# Patient Record
Sex: Female | Born: 1955 | Race: Black or African American | Hispanic: No | Smoking: Never smoker
Health system: Southern US, Community
[De-identification: ages and names within clinical notes are randomized; demographics above are authoritative.]

## PROBLEM LIST (undated history)

## (undated) DIAGNOSIS — M1990 Unspecified osteoarthritis, unspecified site: Secondary | ICD-10-CM

## (undated) DIAGNOSIS — E785 Hyperlipidemia, unspecified: Secondary | ICD-10-CM

## (undated) DIAGNOSIS — F1010 Alcohol abuse, uncomplicated: Secondary | ICD-10-CM

## (undated) DIAGNOSIS — I5042 Chronic combined systolic (congestive) and diastolic (congestive) heart failure: Secondary | ICD-10-CM

## (undated) DIAGNOSIS — G4733 Obstructive sleep apnea (adult) (pediatric): Secondary | ICD-10-CM

## (undated) DIAGNOSIS — I428 Other cardiomyopathies: Secondary | ICD-10-CM

## (undated) DIAGNOSIS — I10 Essential (primary) hypertension: Secondary | ICD-10-CM

## (undated) HISTORY — DX: Unspecified osteoarthritis, unspecified site: M19.90

## (undated) HISTORY — PX: KNEE SURGERY: SHX244

## (undated) HISTORY — DX: Morbid (severe) obesity due to excess calories: E66.01

## (undated) HISTORY — PX: ABDOMINAL HYSTERECTOMY: SHX81

## (undated) HISTORY — PX: OTHER SURGICAL HISTORY: SHX169

## (undated) HISTORY — DX: Essential (primary) hypertension: I10

## (undated) HISTORY — DX: Hyperlipidemia, unspecified: E78.5

## (undated) HISTORY — PX: BREAST SURGERY: SHX581

---

## 2004-07-02 ENCOUNTER — Emergency Department (HOSPITAL_COMMUNITY): Admission: EM | Admit: 2004-07-02 | Discharge: 2004-07-02 | Payer: Self-pay | Admitting: *Deleted

## 2004-07-15 ENCOUNTER — Emergency Department (HOSPITAL_COMMUNITY): Admission: EM | Admit: 2004-07-15 | Discharge: 2004-07-15 | Payer: Self-pay | Admitting: Emergency Medicine

## 2004-11-13 ENCOUNTER — Encounter: Admission: RE | Admit: 2004-11-13 | Discharge: 2004-11-13 | Payer: Self-pay | Admitting: Internal Medicine

## 2007-04-05 ENCOUNTER — Ambulatory Visit: Payer: Self-pay | Admitting: Family Medicine

## 2007-04-06 ENCOUNTER — Ambulatory Visit: Payer: Self-pay | Admitting: *Deleted

## 2007-04-11 ENCOUNTER — Ambulatory Visit: Payer: Self-pay | Admitting: Family Medicine

## 2007-04-13 ENCOUNTER — Ambulatory Visit (HOSPITAL_COMMUNITY): Admission: RE | Admit: 2007-04-13 | Discharge: 2007-04-13 | Payer: Self-pay | Admitting: Family Medicine

## 2007-04-25 ENCOUNTER — Ambulatory Visit: Payer: Self-pay | Admitting: Family Medicine

## 2007-07-22 ENCOUNTER — Ambulatory Visit: Payer: Self-pay | Admitting: Family Medicine

## 2007-07-22 ENCOUNTER — Encounter (INDEPENDENT_AMBULATORY_CARE_PROVIDER_SITE_OTHER): Payer: Self-pay | Admitting: Family Medicine

## 2007-08-22 ENCOUNTER — Ambulatory Visit: Payer: Self-pay | Admitting: Family Medicine

## 2007-08-22 LAB — CONVERTED CEMR LAB
AST: 11 units/L (ref 0–37)
BUN: 14 mg/dL (ref 6–23)
CO2: 24 meq/L (ref 19–32)
Calcium: 9 mg/dL (ref 8.4–10.5)
Chloride: 104 meq/L (ref 96–112)
Creatinine, Ser: 0.82 mg/dL (ref 0.40–1.20)
Eosinophils Absolute: 0.1 10*3/uL — ABNORMAL LOW (ref 0.2–0.7)
Eosinophils Relative: 1 % (ref 0–5)
HCT: 43.7 % (ref 36.0–46.0)
Hemoglobin: 14.1 g/dL (ref 12.0–15.0)
LDL Cholesterol: 113 mg/dL — ABNORMAL HIGH (ref 0–99)
Lymphocytes Relative: 21 % (ref 12–46)
Lymphs Abs: 2.1 10*3/uL (ref 0.7–4.0)
MCHC: 32.3 g/dL (ref 30.0–36.0)
MCV: 88.5 fL (ref 78.0–100.0)
Potassium: 3.9 meq/L (ref 3.5–5.3)
RBC: 4.94 M/uL (ref 3.87–5.11)
Sodium: 140 meq/L (ref 135–145)

## 2007-09-26 ENCOUNTER — Ambulatory Visit: Payer: Self-pay | Admitting: Family Medicine

## 2007-10-11 ENCOUNTER — Emergency Department (HOSPITAL_COMMUNITY): Admission: EM | Admit: 2007-10-11 | Discharge: 2007-10-11 | Payer: Self-pay | Admitting: Emergency Medicine

## 2007-12-01 ENCOUNTER — Emergency Department (HOSPITAL_COMMUNITY): Admission: EM | Admit: 2007-12-01 | Discharge: 2007-12-01 | Payer: Self-pay | Admitting: Emergency Medicine

## 2007-12-20 ENCOUNTER — Ambulatory Visit: Payer: Self-pay | Admitting: Family Medicine

## 2008-01-24 ENCOUNTER — Ambulatory Visit: Payer: Self-pay | Admitting: Family Medicine

## 2008-01-24 ENCOUNTER — Encounter (INDEPENDENT_AMBULATORY_CARE_PROVIDER_SITE_OTHER): Payer: Self-pay | Admitting: Family Medicine

## 2008-01-25 ENCOUNTER — Encounter (INDEPENDENT_AMBULATORY_CARE_PROVIDER_SITE_OTHER): Payer: Self-pay | Admitting: Family Medicine

## 2008-01-25 LAB — CONVERTED CEMR LAB
Chlamydia, DNA Probe: NEGATIVE
GC Probe Amp, Genital: NEGATIVE

## 2008-06-21 ENCOUNTER — Ambulatory Visit: Payer: Self-pay | Admitting: Family Medicine

## 2008-06-22 ENCOUNTER — Encounter (INDEPENDENT_AMBULATORY_CARE_PROVIDER_SITE_OTHER): Payer: Self-pay | Admitting: Family Medicine

## 2008-06-22 LAB — CONVERTED CEMR LAB
AST: 15 units/L (ref 0–37)
Alkaline Phosphatase: 74 units/L (ref 39–117)
BUN: 13 mg/dL (ref 6–23)
Calcium: 9.3 mg/dL (ref 8.4–10.5)
Chloride: 104 meq/L (ref 96–112)
Glucose, Bld: 89 mg/dL (ref 70–99)
Potassium: 4.3 meq/L (ref 3.5–5.3)

## 2008-06-27 ENCOUNTER — Ambulatory Visit (HOSPITAL_COMMUNITY): Admission: RE | Admit: 2008-06-27 | Discharge: 2008-06-27 | Payer: Self-pay | Admitting: Family Medicine

## 2008-07-23 ENCOUNTER — Emergency Department (HOSPITAL_COMMUNITY): Admission: EM | Admit: 2008-07-23 | Discharge: 2008-07-23 | Payer: Self-pay | Admitting: Emergency Medicine

## 2008-07-27 ENCOUNTER — Ambulatory Visit (HOSPITAL_COMMUNITY): Admission: RE | Admit: 2008-07-27 | Discharge: 2008-07-27 | Payer: Self-pay | Admitting: Family Medicine

## 2008-08-20 ENCOUNTER — Emergency Department (HOSPITAL_COMMUNITY): Admission: EM | Admit: 2008-08-20 | Discharge: 2008-08-20 | Payer: Self-pay | Admitting: Family Medicine

## 2008-08-22 ENCOUNTER — Emergency Department (HOSPITAL_COMMUNITY): Admission: EM | Admit: 2008-08-22 | Discharge: 2008-08-22 | Payer: Self-pay | Admitting: Emergency Medicine

## 2008-12-18 ENCOUNTER — Emergency Department (HOSPITAL_COMMUNITY): Admission: EM | Admit: 2008-12-18 | Discharge: 2008-12-18 | Payer: Self-pay | Admitting: Emergency Medicine

## 2008-12-25 ENCOUNTER — Ambulatory Visit: Payer: Self-pay | Admitting: Family Medicine

## 2008-12-26 ENCOUNTER — Ambulatory Visit (HOSPITAL_COMMUNITY): Admission: RE | Admit: 2008-12-26 | Discharge: 2008-12-26 | Payer: Self-pay | Admitting: Family Medicine

## 2009-01-03 ENCOUNTER — Ambulatory Visit (HOSPITAL_COMMUNITY): Admission: RE | Admit: 2009-01-03 | Discharge: 2009-01-03 | Payer: Self-pay | Admitting: Family Medicine

## 2009-01-09 ENCOUNTER — Ambulatory Visit (HOSPITAL_COMMUNITY): Admission: RE | Admit: 2009-01-09 | Discharge: 2009-01-09 | Payer: Self-pay | Admitting: Family Medicine

## 2009-01-30 ENCOUNTER — Emergency Department (HOSPITAL_COMMUNITY): Admission: EM | Admit: 2009-01-30 | Discharge: 2009-01-30 | Payer: Self-pay | Admitting: Emergency Medicine

## 2009-02-14 ENCOUNTER — Ambulatory Visit: Payer: Self-pay | Admitting: Family Medicine

## 2009-12-25 ENCOUNTER — Emergency Department (HOSPITAL_COMMUNITY): Admission: EM | Admit: 2009-12-25 | Discharge: 2009-12-25 | Payer: Self-pay | Admitting: Emergency Medicine

## 2009-12-28 ENCOUNTER — Emergency Department (HOSPITAL_COMMUNITY): Admission: EM | Admit: 2009-12-28 | Discharge: 2009-12-28 | Payer: Self-pay | Admitting: Family Medicine

## 2010-04-22 ENCOUNTER — Emergency Department (HOSPITAL_COMMUNITY): Admission: EM | Admit: 2010-04-22 | Discharge: 2010-04-22 | Payer: Self-pay | Admitting: Emergency Medicine

## 2010-05-16 ENCOUNTER — Emergency Department (HOSPITAL_COMMUNITY): Admission: EM | Admit: 2010-05-16 | Discharge: 2010-05-16 | Payer: Self-pay | Admitting: Emergency Medicine

## 2010-12-28 LAB — URINALYSIS, ROUTINE W REFLEX MICROSCOPIC
Bilirubin Urine: NEGATIVE
Glucose, UA: NEGATIVE mg/dL
Hgb urine dipstick: NEGATIVE
Nitrite: NEGATIVE
Protein, ur: NEGATIVE mg/dL
pH: 6 (ref 5.0–8.0)

## 2010-12-28 LAB — POCT I-STAT, CHEM 8
Calcium, Ion: 1.03 mmol/L — ABNORMAL LOW (ref 1.12–1.32)
Glucose, Bld: 101 mg/dL — ABNORMAL HIGH (ref 70–99)
Hemoglobin: 15.6 g/dL — ABNORMAL HIGH (ref 12.0–15.0)
Potassium: 3.9 mEq/L (ref 3.5–5.1)
Sodium: 140 mEq/L (ref 135–145)
TCO2: 25 mmol/L (ref 0–100)

## 2010-12-28 LAB — RPR: RPR Ser Ql: NONREACTIVE

## 2010-12-28 LAB — GC/CHLAMYDIA PROBE AMP, GENITAL: GC Probe Amp, Genital: NEGATIVE

## 2010-12-28 LAB — WET PREP, GENITAL: WBC, Wet Prep HPF POC: NONE SEEN

## 2010-12-28 LAB — PREGNANCY, URINE: Preg Test, Ur: NEGATIVE

## 2011-01-04 LAB — CULTURE, ROUTINE-ABSCESS

## 2011-05-01 ENCOUNTER — Other Ambulatory Visit: Payer: Self-pay | Admitting: Internal Medicine

## 2011-05-01 DIAGNOSIS — R1011 Right upper quadrant pain: Secondary | ICD-10-CM

## 2011-05-04 ENCOUNTER — Ambulatory Visit (HOSPITAL_COMMUNITY)
Admission: RE | Admit: 2011-05-04 | Discharge: 2011-05-04 | Disposition: A | Discharge status: Home / Self Care | Payer: Self-pay | Source: Ambulatory Visit | Attending: Internal Medicine | Admitting: Internal Medicine

## 2011-05-04 DIAGNOSIS — R7989 Other specified abnormal findings of blood chemistry: Secondary | ICD-10-CM | POA: Insufficient documentation

## 2011-05-04 DIAGNOSIS — R1011 Right upper quadrant pain: Secondary | ICD-10-CM

## 2011-06-02 ENCOUNTER — Inpatient Hospital Stay (INDEPENDENT_AMBULATORY_CARE_PROVIDER_SITE_OTHER)
Admission: RE | Admit: 2011-06-02 | Discharge: 2011-06-02 | Disposition: A | Discharge status: Home / Self Care | Payer: Self-pay | Source: Ambulatory Visit | Attending: Family Medicine | Admitting: Family Medicine

## 2011-06-02 DIAGNOSIS — T7840XA Allergy, unspecified, initial encounter: Secondary | ICD-10-CM

## 2011-06-02 DIAGNOSIS — L509 Urticaria, unspecified: Secondary | ICD-10-CM

## 2011-06-06 ENCOUNTER — Emergency Department (HOSPITAL_COMMUNITY)
Admission: EM | Admit: 2011-06-06 | Discharge: 2011-06-06 | Disposition: A | Discharge status: Home / Self Care | Payer: Self-pay | Attending: Emergency Medicine | Admitting: Emergency Medicine

## 2011-06-06 ENCOUNTER — Emergency Department (HOSPITAL_COMMUNITY): Payer: Self-pay

## 2011-06-06 DIAGNOSIS — R109 Unspecified abdominal pain: Secondary | ICD-10-CM | POA: Insufficient documentation

## 2011-06-06 DIAGNOSIS — I10 Essential (primary) hypertension: Secondary | ICD-10-CM | POA: Insufficient documentation

## 2011-06-06 DIAGNOSIS — R110 Nausea: Secondary | ICD-10-CM | POA: Insufficient documentation

## 2011-06-06 DIAGNOSIS — R748 Abnormal levels of other serum enzymes: Secondary | ICD-10-CM | POA: Insufficient documentation

## 2011-06-06 LAB — DIFFERENTIAL
Basophils Relative: 0 % (ref 0–1)
Eosinophils Absolute: 0.1 10*3/uL (ref 0.0–0.7)
Smear Review: ADEQUATE

## 2011-06-06 LAB — HEPATITIS PANEL, ACUTE
HCV Ab: NEGATIVE
Hep A IgM: NEGATIVE
Hepatitis B Surface Ag: NEGATIVE

## 2011-06-06 LAB — CBC
Hemoglobin: 15.3 g/dL — ABNORMAL HIGH (ref 12.0–15.0)
MCH: 29.7 pg (ref 26.0–34.0)
MCHC: 35.4 g/dL (ref 30.0–36.0)
Platelets: ADEQUATE 10*3/uL (ref 150–400)
RDW: 12.9 % (ref 11.5–15.5)

## 2011-06-06 LAB — URINALYSIS, ROUTINE W REFLEX MICROSCOPIC
Glucose, UA: NEGATIVE mg/dL
Ketones, ur: NEGATIVE mg/dL
Leukocytes, UA: NEGATIVE
Nitrite: NEGATIVE
Protein, ur: NEGATIVE mg/dL
Urobilinogen, UA: 1 mg/dL (ref 0.0–1.0)

## 2011-06-06 LAB — COMPREHENSIVE METABOLIC PANEL
ALT: 122 U/L — ABNORMAL HIGH (ref 0–35)
AST: 149 U/L — ABNORMAL HIGH (ref 0–37)
CO2: 26 mEq/L (ref 19–32)
Calcium: 9.7 mg/dL (ref 8.4–10.5)
Creatinine, Ser: 0.78 mg/dL (ref 0.50–1.10)
GFR calc non Af Amer: >60 mL/min (ref >60)
Sodium: 139 mEq/L (ref 135–145)
Total Protein: 7.8 g/dL (ref 6.0–8.3)

## 2011-06-06 MED ORDER — IOHEXOL 300 MG/ML  SOLN
125.0000 mL | Freq: Once | INTRAMUSCULAR | Status: AC | PRN
  Administered 2011-06-06: 125 mL via INTRAVENOUS

## 2011-06-08 ENCOUNTER — Encounter: Payer: Self-pay | Admitting: Internal Medicine

## 2011-06-08 ENCOUNTER — Emergency Department (HOSPITAL_COMMUNITY): Payer: Self-pay

## 2011-06-08 ENCOUNTER — Emergency Department (HOSPITAL_COMMUNITY)
Admission: EM | Admit: 2011-06-08 | Discharge: 2011-06-08 | Disposition: A | Discharge status: Home / Self Care | Payer: Self-pay | Attending: Emergency Medicine | Admitting: Emergency Medicine

## 2011-06-08 DIAGNOSIS — K7689 Other specified diseases of liver: Secondary | ICD-10-CM | POA: Insufficient documentation

## 2011-06-08 DIAGNOSIS — R109 Unspecified abdominal pain: Secondary | ICD-10-CM | POA: Insufficient documentation

## 2011-06-08 DIAGNOSIS — K219 Gastro-esophageal reflux disease without esophagitis: Secondary | ICD-10-CM | POA: Insufficient documentation

## 2011-07-07 ENCOUNTER — Ambulatory Visit: Payer: Self-pay | Admitting: Internal Medicine

## 2011-07-14 LAB — CULTURE, ROUTINE-ABSCESS

## 2011-07-31 ENCOUNTER — Telehealth: Payer: Self-pay | Admitting: Internal Medicine

## 2011-07-31 NOTE — Telephone Encounter (Signed)
Message copied by Arna Snipe on Fri Jul 31, 2011  7:57 AM ------      Message from: Francia Greaves      Created: Tue Jul 07, 2011  2:53 PM       Charge patient no show fee for 07/07/11

## 2011-11-17 IMAGING — CT CT ABD-PELV W/ CM
2 of 5 series · 17 of 46 positions shown, 19 images · IV contrast (APPLIED)
Comparison: [DATE].

CLINICAL DATA: Abdominal pain.  Nausea.

CT ABDOMEN AND PELVIS WITH CONTRAST
TECHNIQUE: Multidetector CT imaging of the abdomen and pelvis was
performed following the standard protocol during bolus
administration of intravenous contrast.
Contrast: 125 ml Omnipaque-KOO

[Series 2: abd_pel 5.0 b40f st · axial · 0.75mm/px · z∈[-516,-112]mm · 14 of 91 slices shown, 16 images]
[im 5/91  soft-tissue]
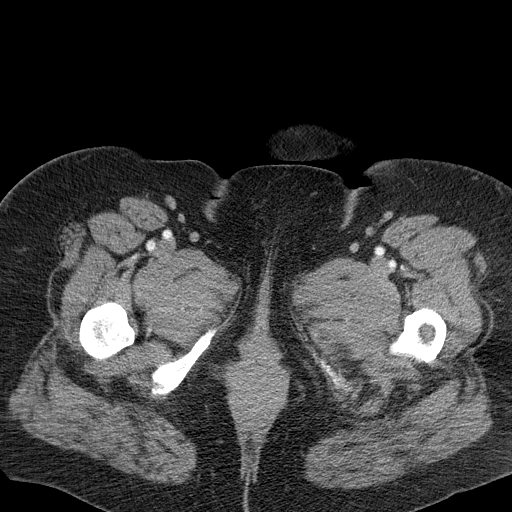
[im 5/91  bone]
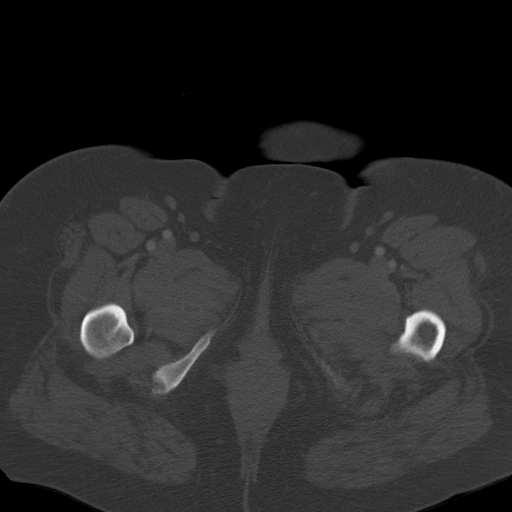
[im 10/91  soft-tissue]
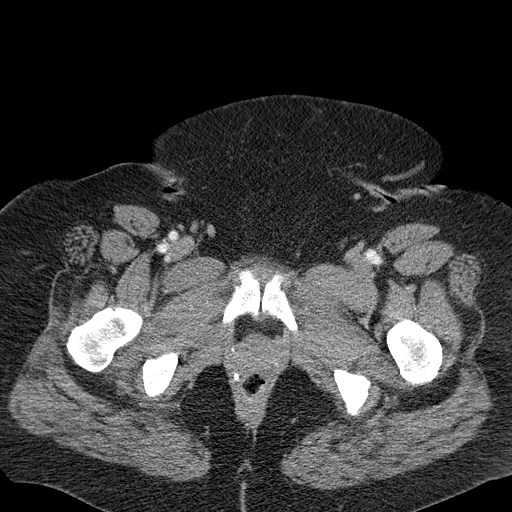
[im 19/91  soft-tissue]
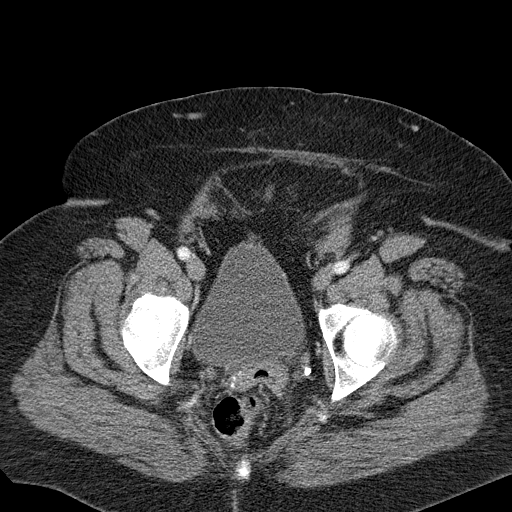
[im 24/91  soft-tissue]
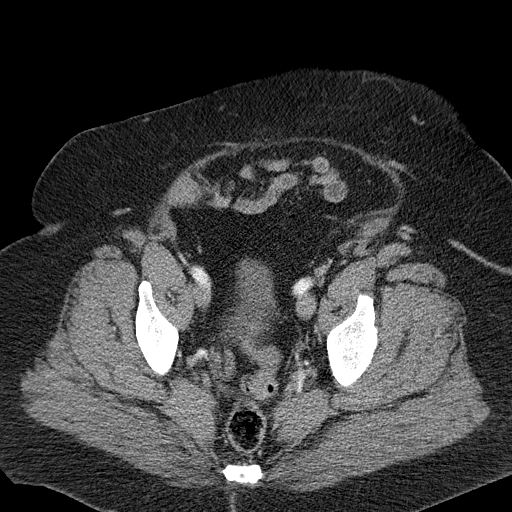
[im 29/91  soft-tissue]
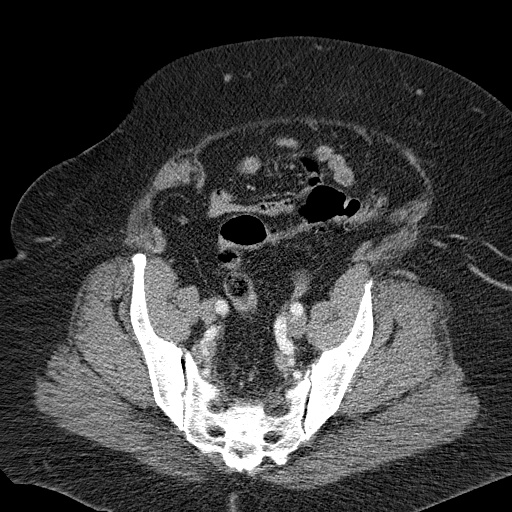
[im 38/91  soft-tissue]
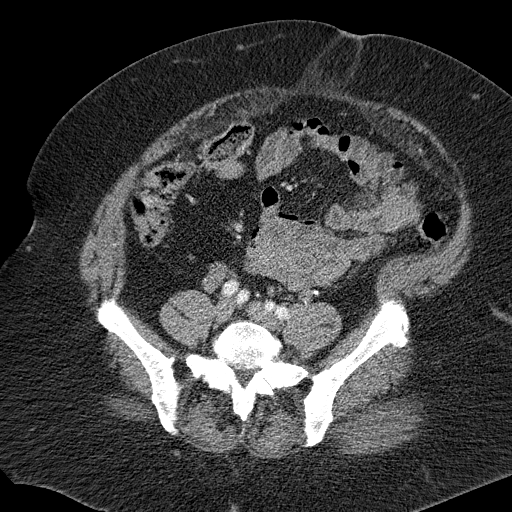
[im 43/91  soft-tissue]
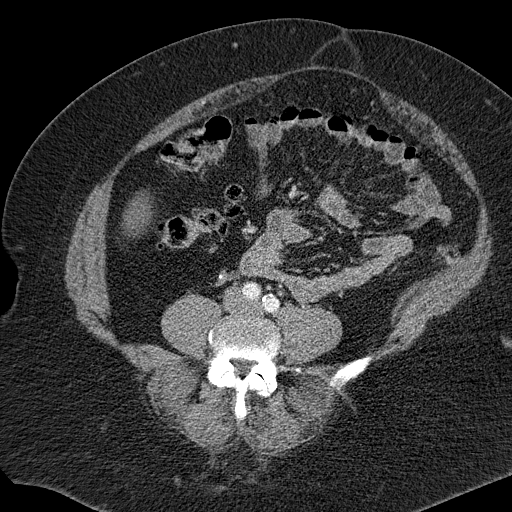
[im 48/91  soft-tissue]
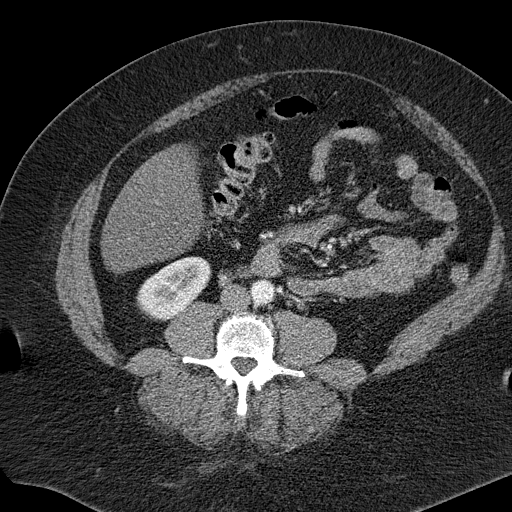
[im 53/91  soft-tissue]
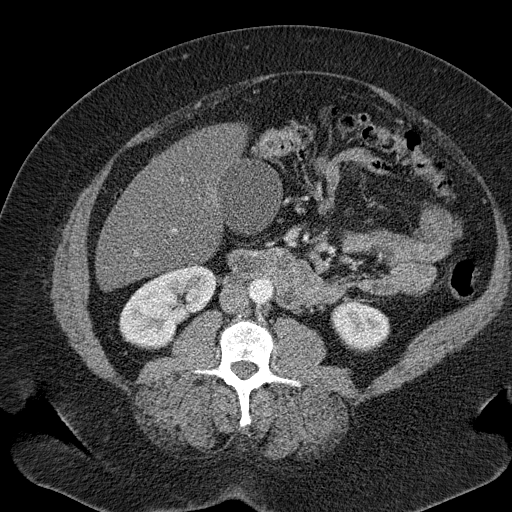
[im 53/91  bone]
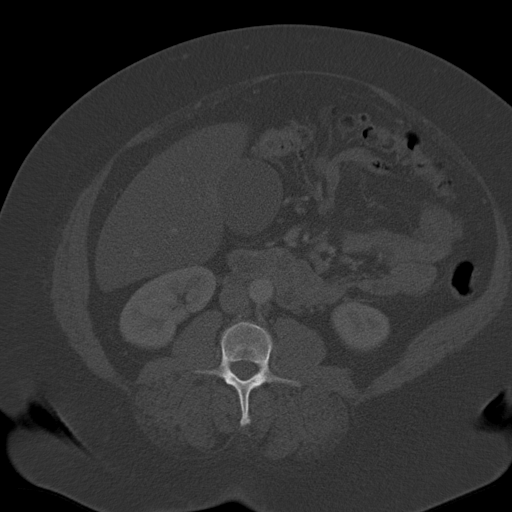
[im 62/91  soft-tissue]
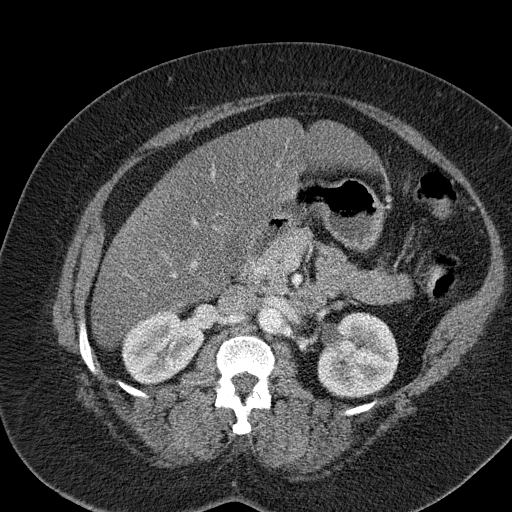
[im 67/91  soft-tissue]
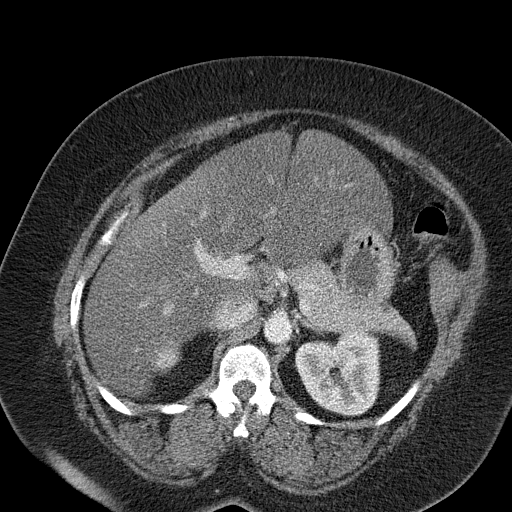
[im 72/91  soft-tissue]
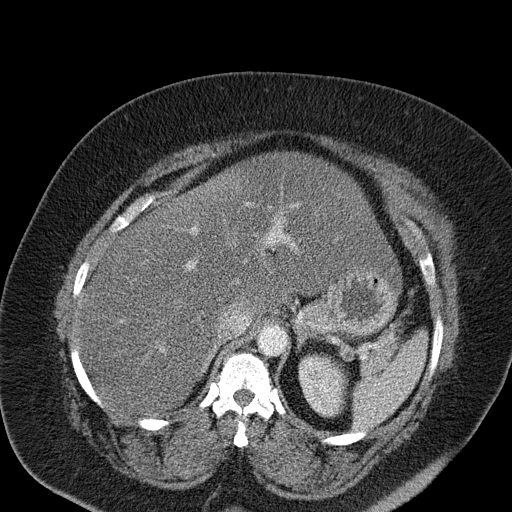
[im 81/91  soft-tissue]
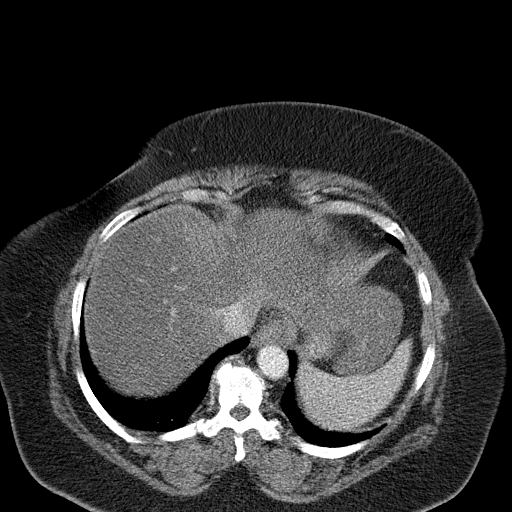
[im 86/91  soft-tissue]
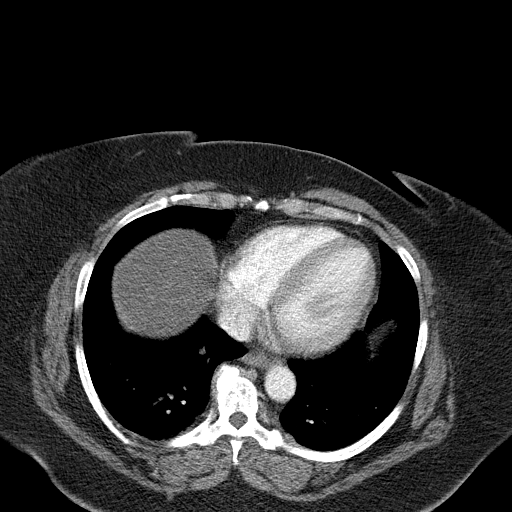

[Series 602: coronal · coronal · 0.92mm/px · 3 of 114 slices shown]
[im 38/114  soft-tissue]
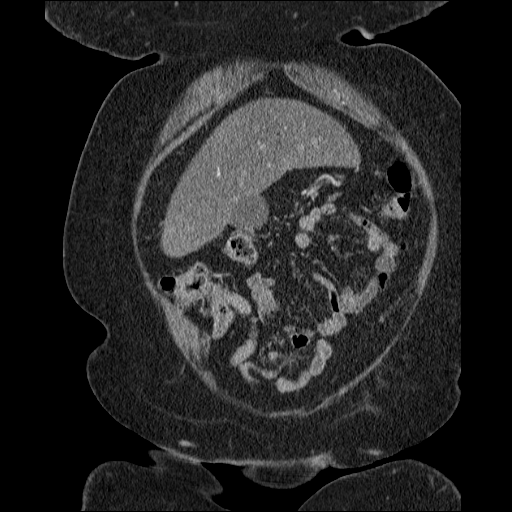
[im 51/114  soft-tissue]
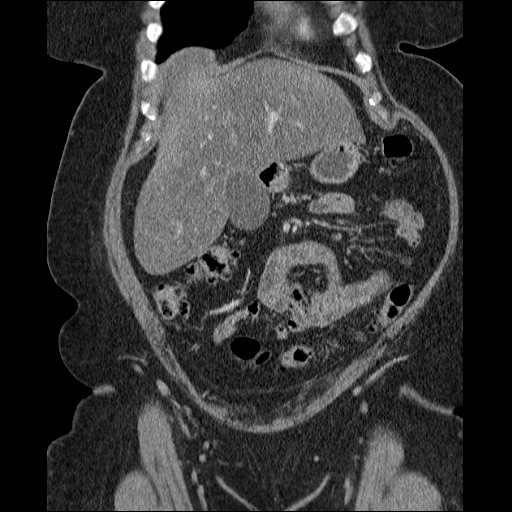
[im 63/114  soft-tissue]
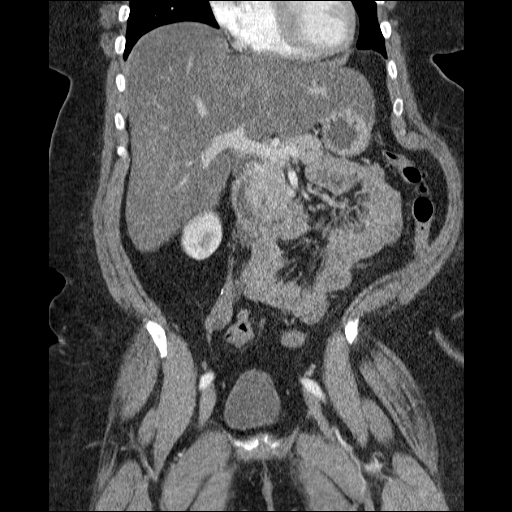

[17 of 46 positions shown; findings below may reference images not displayed]

FINDINGS: The liver is diffusely fatty measures 22.4 cm in cranial
caudal length, enlarged.  No focal abnormalities seen within the
hepatic parenchyma.  Spleen is unremarkable.  The stomach,
duodenum, pancreas, gallbladder, adrenal glands, and kidneys have
normal imaging features.

No abdominal aortic aneurysm.  No free fluid or lymphadenopathy in
the abdomen.

Imaging through the pelvis shows no intraperitoneal free fluid.  No
pelvic sidewall lymphadenopathy.  Bladder is normal.  Uterus is
surgically absent.  No adnexal mass.

Diverticular changes are seen in the sigmoid colon without
diverticulitis.  Terminal ileum is normal.  The appendix is normal.

Bone windows reveal no worrisome lytic or sclerotic osseous
lesions.  Umbilical hernia contains only fat
IMPRESSION: Fatty enlarged liver.

No acute findings in the abdomen or pelvis.

## 2011-11-19 IMAGING — US US ABDOMEN COMPLETE
1 series · 14 of 25 positions shown · non-contrast
Comparison: 06/06/2011 CT

CLINICAL DATA: Abdominal pain.

COMPLETE ABDOMINAL ULTRASOUND

[Series 1: us abdomen complete · 0.33mm/px · 14 of 46 slices shown]
[im 1/46]
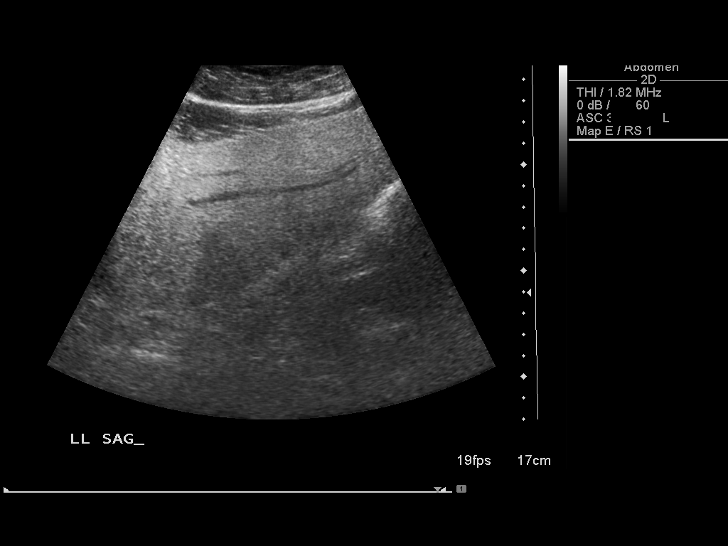
[im 4/46]
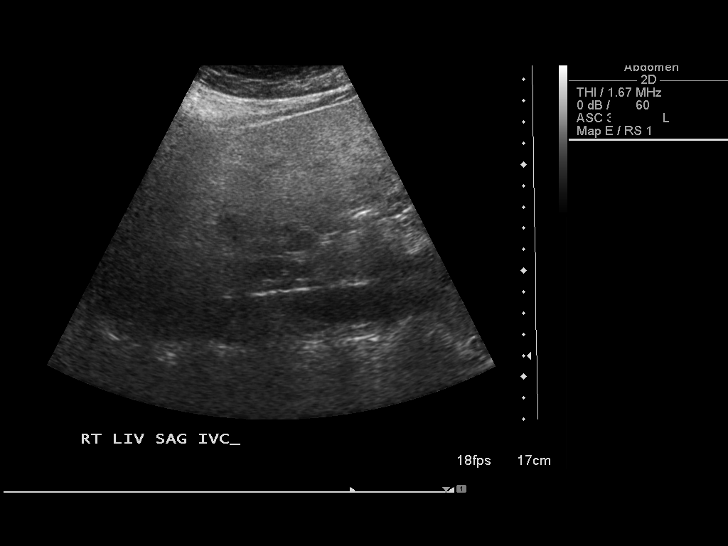
[im 8/46]
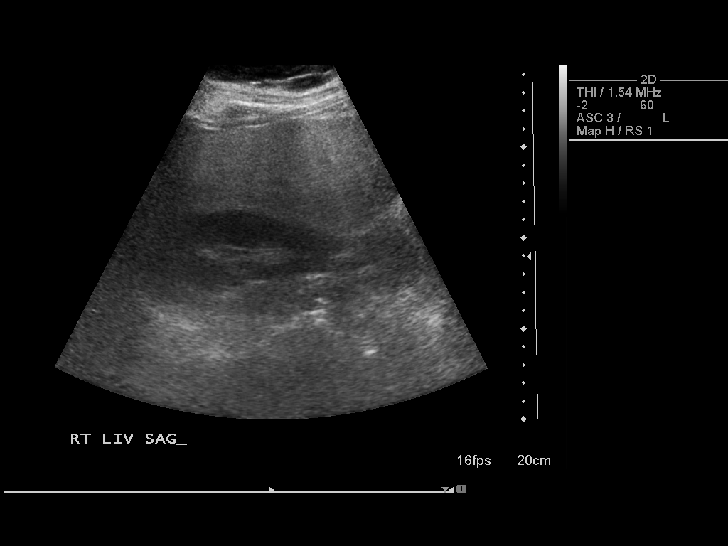
[im 12/46]
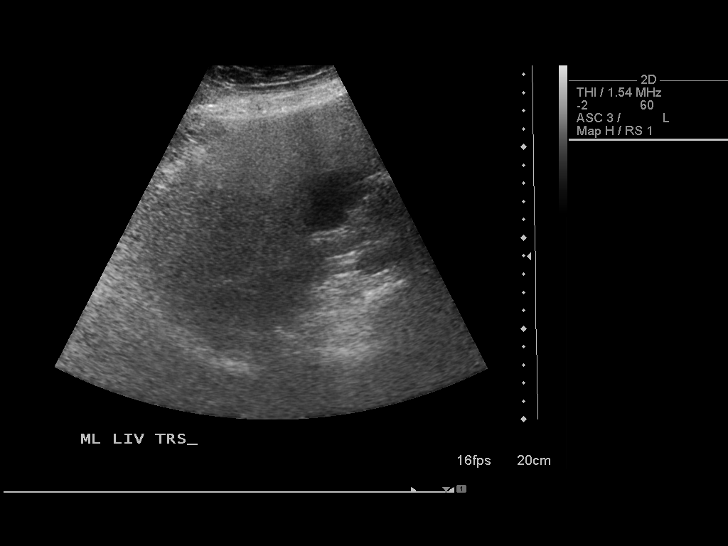
[im 16/46]
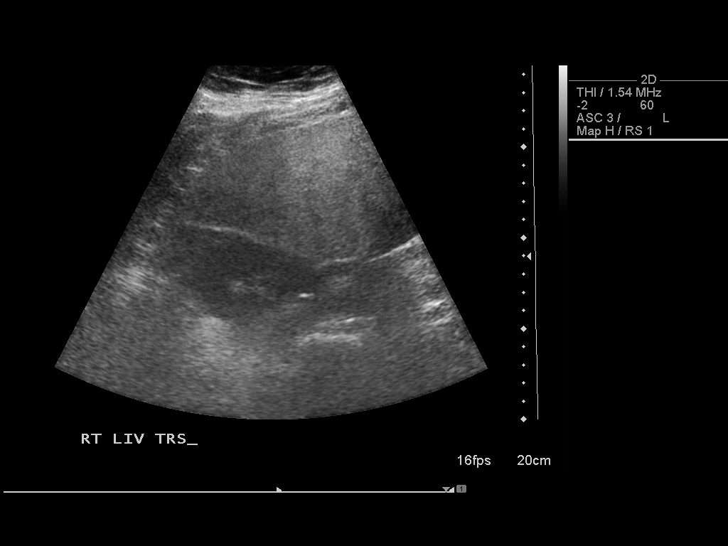
[im 17/46]
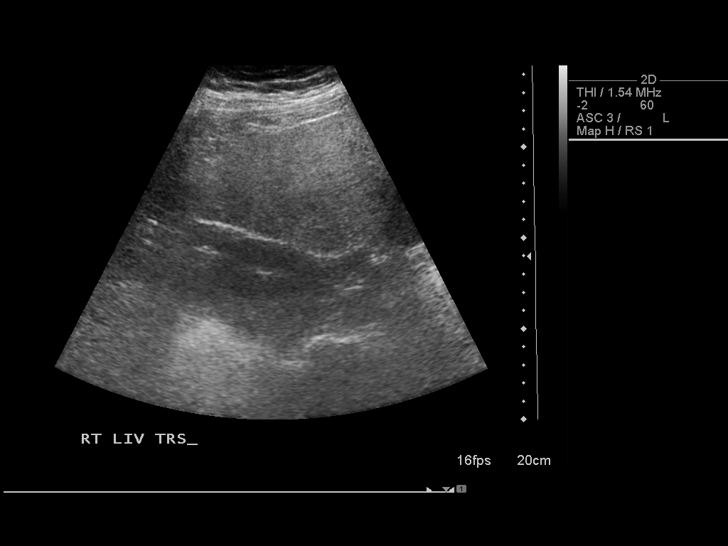
[im 21/46]
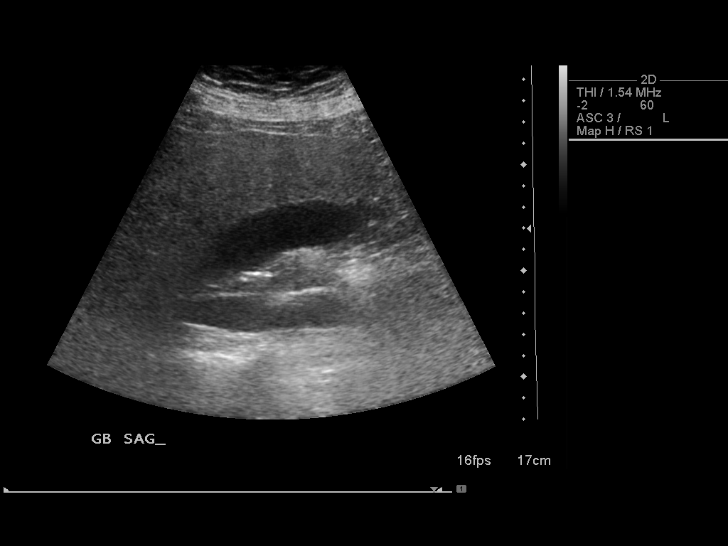
[im 25/46]
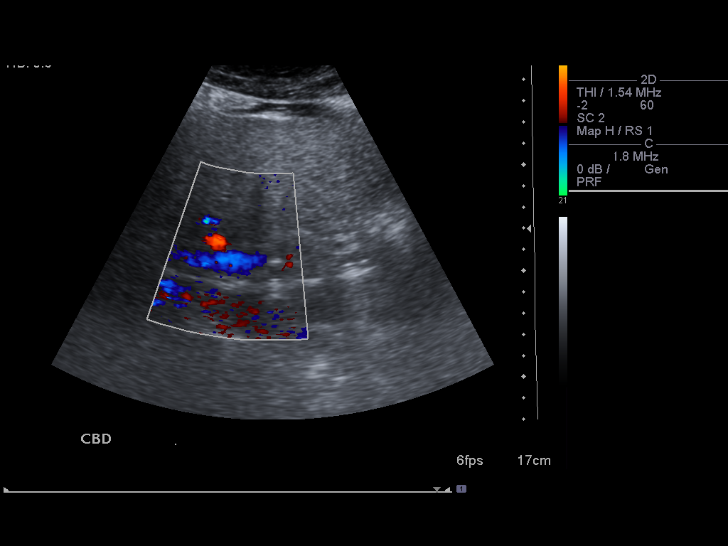
[im 29/46]
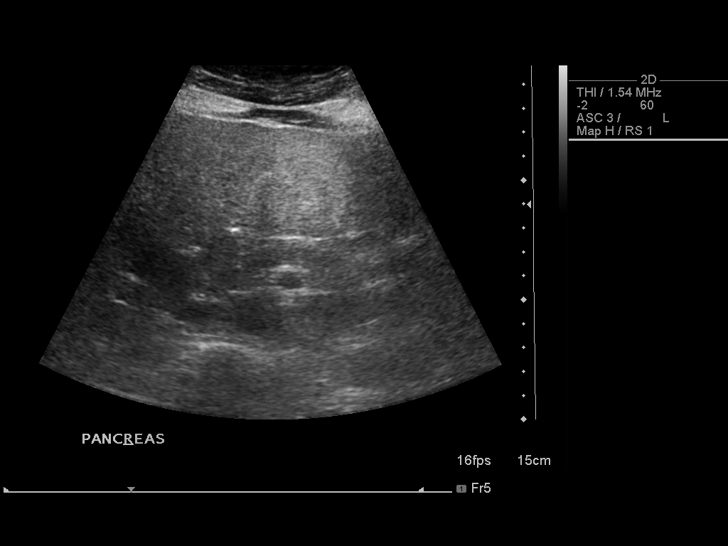
[im 31/46]
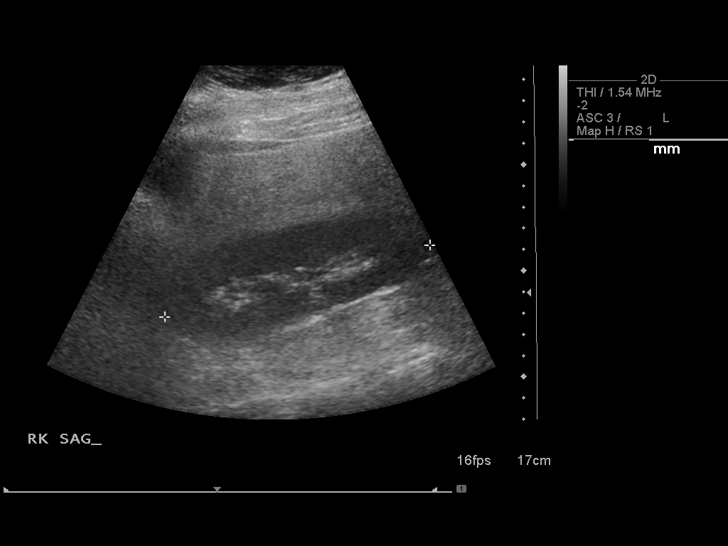
[im 34/46]
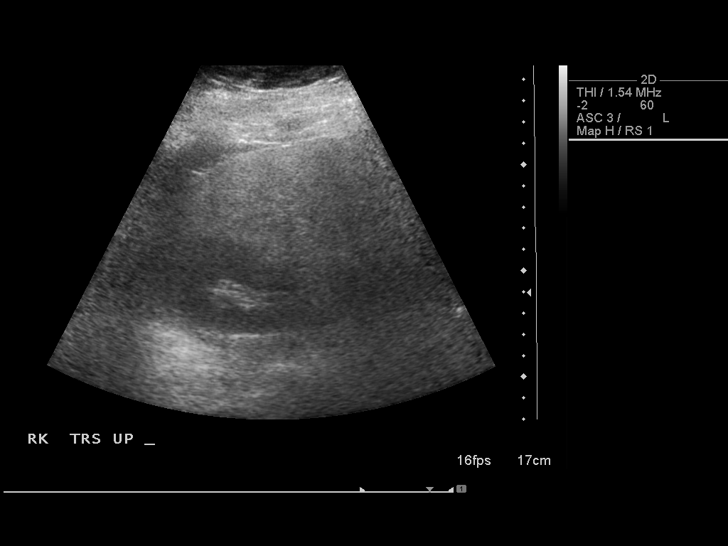
[im 38/46]
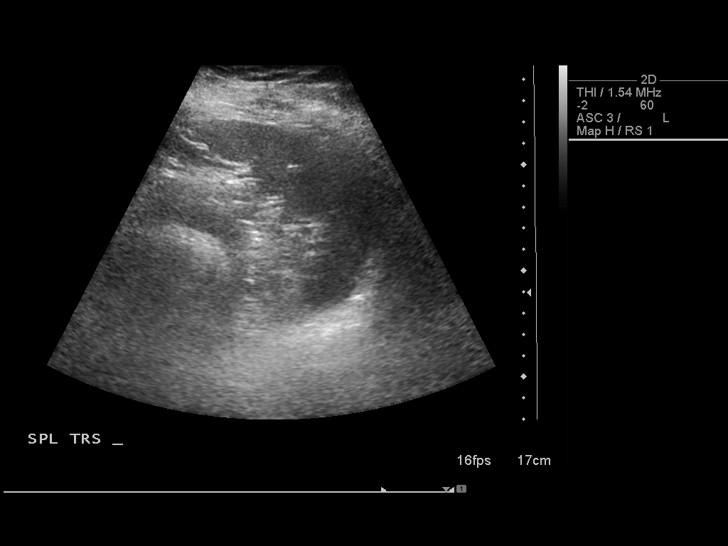
[im 42/46]
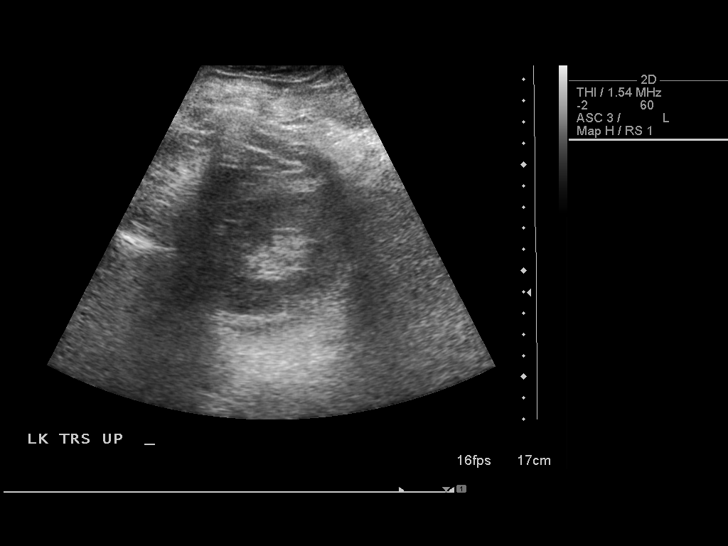
[im 46/46]
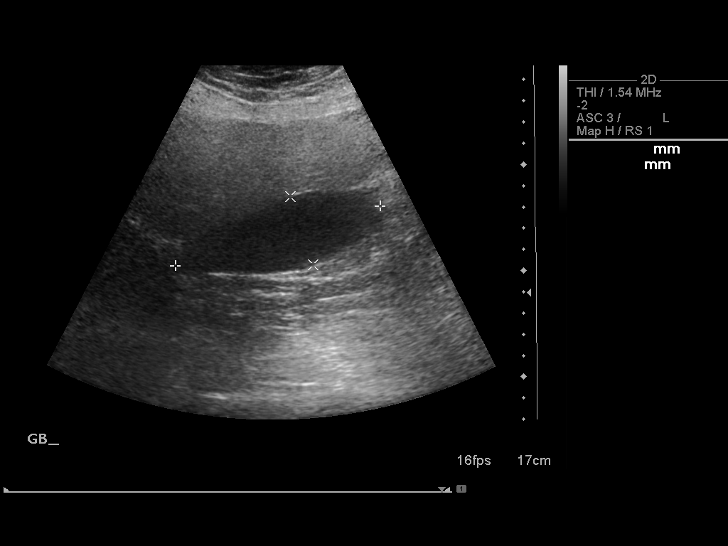

[14 of 25 positions shown; findings below may reference images not displayed]

FINDINGS: Gallbladder:  No gallstones, gallbladder wall thickening, or
pericholecystic fluid.

Common bile duct:  Limited visualization, measures up to 5 mm
proximally.  Distally not well seen.

Liver:  Diffuse increased echogenicity with geographic areas of
fatty sparing. No focal lesion identified

IVC:  Appears normal.

Pancreas:  Inadequately visualized.

Spleen:  Measures 10 cm, within normal limits.

Right Kidney:  Normal size and echogenicity.  No hydronephrosis.
Measures 12.9 cm.

Left Kidney:  Normal size and echogenicity.  No hydronephrosis.
Measures 13.3 cm.

Abdominal aorta:  Limited visualization, measures up to 2.4 cm
where seen.
IMPRESSION: Hepatic steatosis.

No cholelithiasis or sonographic evidence for cholecystitis.

## 2012-07-12 ENCOUNTER — Other Ambulatory Visit (HOSPITAL_COMMUNITY): Payer: Self-pay | Admitting: Internal Medicine

## 2012-07-12 DIAGNOSIS — Z1231 Encounter for screening mammogram for malignant neoplasm of breast: Secondary | ICD-10-CM

## 2012-07-22 ENCOUNTER — Ambulatory Visit (HOSPITAL_COMMUNITY)
Admission: RE | Admit: 2012-07-22 | Discharge: 2012-07-22 | Disposition: A | Discharge status: Home / Self Care | Payer: BC Managed Care – PPO | Source: Ambulatory Visit | Attending: Internal Medicine | Admitting: Internal Medicine

## 2012-07-22 DIAGNOSIS — Z1231 Encounter for screening mammogram for malignant neoplasm of breast: Secondary | ICD-10-CM | POA: Insufficient documentation

## 2012-11-23 ENCOUNTER — Emergency Department (HOSPITAL_COMMUNITY)
Admission: EM | Admit: 2012-11-23 | Discharge: 2012-11-23 | Disposition: A | Discharge status: Home / Self Care | Payer: BC Managed Care – PPO | Source: Home / Self Care | Attending: Family Medicine | Admitting: Family Medicine

## 2012-11-23 ENCOUNTER — Encounter (HOSPITAL_COMMUNITY): Payer: Self-pay | Admitting: *Deleted

## 2012-11-23 DIAGNOSIS — L8992 Pressure ulcer of unspecified site, stage 2: Secondary | ICD-10-CM

## 2012-11-23 DIAGNOSIS — H0019 Chalazion unspecified eye, unspecified eyelid: Secondary | ICD-10-CM

## 2012-11-23 DIAGNOSIS — L8990 Pressure ulcer of unspecified site, unspecified stage: Secondary | ICD-10-CM

## 2012-11-23 DIAGNOSIS — H0011 Chalazion right upper eyelid: Secondary | ICD-10-CM

## 2012-11-23 MED ORDER — SILVER SULFADIAZINE 1 % EX CREA: TOPICAL_CREAM | Freq: Every day | CUTANEOUS | Status: DC

## 2012-11-23 MED ORDER — ERYTHROMYCIN 5 MG/GM OP OINT: TOPICAL_OINTMENT | OPHTHALMIC | Status: DC

## 2012-11-23 MED ORDER — NYSTATIN 100000 UNIT/GM EX CREA: TOPICAL_CREAM | Freq: Two times a day (BID) | CUTANEOUS | Status: DC

## 2012-11-23 NOTE — ED Notes (Signed)
Wound  Care  With  Non   Adherent  Dressing  Applied  To  Affected  Area  Under r  breast

## 2012-11-23 NOTE — ED Notes (Signed)
Pt        Has   A  Sore  Under  r  Breast        X  6  Days  As  Well  As   A  Stye  Above  The   r  Eye       She  Reports  She  Has  been  Applying  Bacitracin  To the  Affected  Area        She  Is  Sitting upright on  The  Exam table  In  No  Distress    Speaking in  Complete  sentances

## 2012-11-23 NOTE — ED Provider Notes (Signed)
History     CSN: [id number]  Arrival date & time 11/23/12  1506   First MD Initiated Contact with Patient 11/23/12 1510      Chief Complaint  Patient presents with  . Stye    (Consider location/radiation/quality/duration/timing/severity/associated sxs/prior treatment) HPI Comments: 57 year old female with no known chronic medical conditions. Here complaining of:  #1 right upper lid "knot" patient has not is this for several weeks, but impress that is getting bigger during the last week. Denies redness swelling or drainage.  #2 there is a skin sore below her right breast for 6 days. She has been self treating with bacitracin and has improved some. No fever or chills. No history of diabetes. Patient denies itchiness or maceration below her breast before the skin sore appeared. Normal mammography per records in 07/2012.    History reviewed. No pertinent past medical history.  Past Surgical History  Procedure Laterality Date  . Btl    . Abdominal hysterectomy      History reviewed. No pertinent family history.  History  Substance Use Topics  . Smoking status: Not on file  . Smokeless tobacco: Not on file  . Alcohol Use: No    OB History   Grav Para Term Preterm Abortions TAB SAB Ect Mult Living                  Review of Systems  Constitutional: Negative for fever and chills.  HENT: Negative for congestion.   Eyes: Negative for visual disturbance.       As per HPI  Gastrointestinal: Negative for nausea, vomiting, abdominal pain and diarrhea.  Genitourinary: Positive for dysuria and frequency. Negative for flank pain, vaginal bleeding, vaginal discharge, menstrual problem and pelvic pain.  Skin: Negative for rash.  Neurological: Negative for headaches.  All other systems reviewed and are negative.    Allergies  Review of patient's allergies indicates no known allergies.  Home Medications   Current Outpatient Rx  Name  Route  Sig  Dispense  Refill  .  erythromycin ophthalmic ointment      Place a 1/2 inch ribbon of ointment into the right lower eyelid 3 times a day for 5-7 days.   1 g   0   . nystatin cream (MYCOSTATIN)   Topical   Apply topically 2 (two) times daily.   30 g   0   . silver sulfADIAZINE (SILVADENE) 1 % cream   Topical   Apply topically daily.   50 g   0     BP 154/84  Pulse 99  Temp(Src) 98.6 F (37 C) (Oral)  Resp 18  SpO2 96%  Physical Exam  Nursing note and vitals reviewed. Constitutional: She is oriented to person, place, and time. She appears well-developed.  Eyes: Conjunctivae and EOM are normal. Pupils are equal, round, and reactive to light. Right eye exhibits no discharge. Left eye exhibits no discharge.  There is a round induration between middle and external third of right upper lid. No lid swelling or exudates. No significant blepharitis. No fluctuation, erythema or spontaneous drainage.   Neck: Neck supple.  Cardiovascular: Normal heart sounds.   Pulmonary/Chest: Breath sounds normal.  Lymphadenopathy:    She has no cervical adenopathy.  Neurological: She is alert and oriented to person, place, and time. She has normal reflexes.  Skin:  There is a superficial ulcer about 2.5 cm hight and 3cm wide, has a pink center and appears healing in concentric fashion, sore is located in  right lower breast area close to a old surgical scar from old breast reduction procedure. No inflammation, induration, erythema or drainage associated. No maceration, redness or exudates in breast folds. No other skin changes of the breast. Nipple appears intact with no drainage. Patient has large breast despite prior reduction procedure. No gross mass or nodes palpated. No tender or enlarged axillary lymph nodes.   Psychiatric: She has a normal mood and affect. Her behavior is normal. Judgment and thought content normal.    ED Course  Procedures (including critical care time)  Labs Reviewed - No data to display No  results found.   1. Chalazion of right upper eyelid   2. Pressure sore, stage II       MDM  Impressed not infected chalazion of the right upper eyelid. Patient reports tenderness and will be applying massage. Prescribed erythromycin ointment and supportive care per discussion and hand out. ophthalmology referral as needed.  Pressure sore impress related to friction with bra and large heavy breast treated with no adhesive dressing, silvadene and alternating nystatin cream. CBG normal. Supportive care and red flags that should prompt her return to medical attention discussed 101 and provided in writing.        Sharin Grave, MD 11/24/12 1235

## 2012-11-25 LAB — GLUCOSE, CAPILLARY: Glucose-Capillary: 89 mg/dL (ref 70–99)

## 2014-08-26 ENCOUNTER — Emergency Department (HOSPITAL_COMMUNITY): Payer: BC Managed Care – PPO

## 2014-08-26 ENCOUNTER — Emergency Department (HOSPITAL_COMMUNITY)
Admission: EM | Admit: 2014-08-26 | Discharge: 2014-08-26 | Disposition: A | Discharge status: Home / Self Care | Payer: Self-pay | Attending: Emergency Medicine | Admitting: Emergency Medicine

## 2014-08-26 ENCOUNTER — Encounter (HOSPITAL_COMMUNITY): Payer: Self-pay | Admitting: Emergency Medicine

## 2014-08-26 DIAGNOSIS — R109 Unspecified abdominal pain: Secondary | ICD-10-CM | POA: Insufficient documentation

## 2014-08-26 DIAGNOSIS — Z79899 Other long term (current) drug therapy: Secondary | ICD-10-CM | POA: Insufficient documentation

## 2014-08-26 DIAGNOSIS — Z90710 Acquired absence of both cervix and uterus: Secondary | ICD-10-CM | POA: Insufficient documentation

## 2014-08-26 LAB — URINALYSIS, ROUTINE W REFLEX MICROSCOPIC
Bilirubin Urine: NEGATIVE
GLUCOSE, UA: NEGATIVE mg/dL
HGB URINE DIPSTICK: NEGATIVE
Ketones, ur: NEGATIVE mg/dL
LEUKOCYTES UA: NEGATIVE
Nitrite: NEGATIVE
PH: 5.5 (ref 5.0–8.0)
Protein, ur: NEGATIVE mg/dL
SPECIFIC GRAVITY, URINE: 1.017 (ref 1.005–1.030)
Urobilinogen, UA: 0.2 mg/dL (ref 0.0–1.0)

## 2014-08-26 LAB — COMPREHENSIVE METABOLIC PANEL
ALBUMIN: 3.7 g/dL (ref 3.5–5.2)
ALT: 19 U/L (ref 0–35)
AST: 22 U/L (ref 0–37)
Alkaline Phosphatase: 88 U/L (ref 39–117)
Anion gap: 18 — ABNORMAL HIGH (ref 5–15)
BUN: 10 mg/dL (ref 6–23)
CALCIUM: 9.5 mg/dL (ref 8.4–10.5)
CO2: 23 meq/L (ref 19–32)
Chloride: 100 mEq/L (ref 96–112)
Creatinine, Ser: 0.64 mg/dL (ref 0.50–1.10)
GFR calc Af Amer: >90 mL/min (ref >90)
GFR calc non Af Amer: >90 mL/min (ref >90)
Glucose, Bld: 126 mg/dL — ABNORMAL HIGH (ref 70–99)
Potassium: 3.8 mEq/L (ref 3.7–5.3)
SODIUM: 141 meq/L (ref 137–147)
TOTAL PROTEIN: 8.4 g/dL — AB (ref 6.0–8.3)
Total Bilirubin: 0.6 mg/dL (ref 0.3–1.2)

## 2014-08-26 LAB — CBC WITH DIFFERENTIAL/PLATELET
BASOS ABS: 0 10*3/uL (ref 0.0–0.1)
BASOS PCT: 0 % (ref 0–1)
EOS ABS: 0.2 10*3/uL (ref 0.0–0.7)
EOS PCT: 1 % (ref 0–5)
HCT: 42.4 % (ref 36.0–46.0)
Hemoglobin: 14.7 g/dL (ref 12.0–15.0)
LYMPHS PCT: 16 % (ref 12–46)
Lymphs Abs: 2.1 10*3/uL (ref 0.7–4.0)
MCH: 28.5 pg (ref 26.0–34.0)
MCHC: 34.7 g/dL (ref 30.0–36.0)
MCV: 82.2 fL (ref 78.0–100.0)
Monocytes Absolute: 0.9 10*3/uL (ref 0.1–1.0)
Monocytes Relative: 7 % (ref 3–12)
Neutro Abs: 10.1 10*3/uL — ABNORMAL HIGH (ref 1.7–7.7)
Neutrophils Relative %: 76 % (ref 43–77)
PLATELETS: 232 10*3/uL (ref 150–400)
RBC: 5.16 MIL/uL — ABNORMAL HIGH (ref 3.87–5.11)
RDW: 12.9 % (ref 11.5–15.5)
WBC: 13.2 10*3/uL — AB (ref 4.0–10.5)

## 2014-08-26 LAB — LIPASE, BLOOD: LIPASE: 37 U/L (ref 11–59)

## 2014-08-26 MED ORDER — SODIUM CHLORIDE 0.9 % IV BOLUS (SEPSIS)
1000.0000 mL | Freq: Once | INTRAVENOUS | Status: AC
  Administered 2014-08-26: 1000 mL via INTRAVENOUS

## 2014-08-26 MED ORDER — MORPHINE SULFATE 4 MG/ML IJ SOLN
4.0000 mg | Freq: Once | INTRAMUSCULAR | Status: AC
  Administered 2014-08-26: 4 mg via INTRAVENOUS
  Filled 2014-08-26: qty 1

## 2014-08-26 MED ORDER — ONDANSETRON HCL 4 MG/2ML IJ SOLN
4.0000 mg | Freq: Once | INTRAMUSCULAR | Status: AC
  Administered 2014-08-26: 4 mg via INTRAVENOUS
  Filled 2014-08-26: qty 2

## 2014-08-26 MED ORDER — CYCLOBENZAPRINE HCL 5 MG PO TABS: 5.0000 mg | ORAL_TABLET | Freq: Three times a day (TID) | ORAL | Status: DC | PRN

## 2014-08-26 MED ORDER — CYCLOBENZAPRINE HCL 10 MG PO TABS
5.0000 mg | ORAL_TABLET | Freq: Once | ORAL | Status: AC
  Administered 2014-08-26: 5 mg via ORAL
  Filled 2014-08-26: qty 1

## 2014-08-26 MED ORDER — HYDROCODONE-ACETAMINOPHEN 5-325 MG PO TABS: 1.0000 | ORAL_TABLET | Freq: Four times a day (QID) | ORAL | Status: DC | PRN

## 2014-08-26 NOTE — ED Notes (Signed)
Patient transported to CT 

## 2014-08-26 NOTE — ED Notes (Signed)
Awake. Verbally responsive. Resp even and unlabored. ABC's intact. Pt denies n/v/d. Family at bedside.

## 2014-08-26 NOTE — Discharge Instructions (Signed)
Flank Pain °Flank pain refers to pain that is located on the side of the body between the upper abdomen and the back. The pain may occur over a short period of time (acute) or may be long-term or reoccurring (chronic). It may be mild or severe. Flank pain can be caused by many things. °CAUSES  °Some of the more common causes of flank pain include: °· Muscle strains.   °· Muscle spasms.   °· A disease of your spine (vertebral disk disease).   °· A lung infection (pneumonia).   °· Fluid around your lungs (pulmonary edema).   °· A kidney infection.   °· Kidney stones.   °· A very painful skin rash caused by the chickenpox virus (shingles).   °· Gallbladder disease.   °HOME CARE INSTRUCTIONS  °Home care will depend on the cause of your pain. In general, °· Rest as directed by your caregiver. °· Drink enough fluids to keep your urine clear or pale yellow. °· Only take over-the-counter or prescription medicines as directed by your caregiver. Some medicines may help relieve the pain. °· Tell your caregiver about any changes in your pain. °· Follow up with your caregiver as directed. °SEEK IMMEDIATE MEDICAL CARE IF:  °· Your pain is not controlled with medicine.   °· You have new or worsening symptoms. °· Your pain increases.   °· You have abdominal pain.   °· You have shortness of breath.   °· You have persistent nausea or vomiting.   °· You have swelling in your abdomen.   °· You feel faint or pass out.   °· You have blood in your urine. °· You have a fever or persistent symptoms for more than 2-3 days. °· You have a fever and your symptoms suddenly get worse. °MAKE SURE YOU:  °· Understand these instructions. °· Will watch your condition. °· Will get help right away if you are not doing well or get worse. °Document Released: 11/19/2005 Document Revised: 06/22/2012 Document Reviewed: 05/12/2012 °[employer]® Patient Information ©2015 [employer]. This information is not intended to replace advice given to you by your  health care provider. Make sure you discuss any questions you have with your health care provider. ° °

## 2014-08-26 NOTE — ED Notes (Signed)
Pt requesting more pain medicine, will notify RN.

## 2014-08-26 NOTE — ED Notes (Signed)
Awake. Verbally responsive. Resp even and unlabored. ABC's intact. 

## 2014-08-26 NOTE — ED Notes (Signed)
Awake. Verbally responsive. Resp even and unlabored. ABC's intact. IV saline lock patent and intact. Family at bedside.

## 2014-08-26 NOTE — ED Notes (Signed)
Per EMS: pt c/o right flank pain x 24 hrs near buttocks. Pt states she had pain like this 1 year ago and nothing found. Denies n/v. PT has normal urination.

## 2014-08-26 NOTE — ED Provider Notes (Signed)
CSN: [id number]     Arrival date & time 08/26/14  1647 History   First MD Initiated Contact with Patient 08/26/14 1654     Chief Complaint  Patient presents with  . Flank Pain     (Consider location/radiation/quality/duration/timing/severity/associated sxs/prior Treatment) Patient is a 58 y.o. female presenting with flank pain.  Flank Pain This is a new problem. The current episode started 12 to 24 hours ago. Episode frequency: intermittent.  Started last night, resolved by this morning, came back this afternoon and has been constant since. The problem has been gradually worsening. Pertinent negatives include no chest pain, no abdominal pain and no shortness of breath. Associated symptoms comments: No fevers. Nothing aggravates the symptoms. Relieved by: percocet and ibuprofen.    History reviewed. No pertinent past medical history. Past Surgical History  Procedure Laterality Date  . Btl    . Abdominal hysterectomy     No family history on file. History  Substance Use Topics  . Smoking status: Not on file  . Smokeless tobacco: Not on file  . Alcohol Use: No   OB History    No data available     Review of Systems  Respiratory: Negative for shortness of breath.   Cardiovascular: Negative for chest pain.  Gastrointestinal: Negative for abdominal pain.  Genitourinary: Positive for flank pain.  All other systems reviewed and are negative.     Allergies  Review of patient's allergies indicates no known allergies.  Home Medications   Prior to Admission medications   Medication Sig Start Date End Date Taking? Authorizing Provider  ibuprofen (ADVIL,MOTRIN) 200 MG tablet Take 400 mg by mouth every 6 (six) hours as needed for headache or moderate pain.   Yes Historical Provider, MD  Oxycodone-Acetaminophen (PERCOCET PO) Take 1 tablet by mouth once.   Yes Historical Provider, MD  erythromycin ophthalmic ointment Place a 1/2 inch ribbon of ointment into the right lower eyelid 3  times a day for 5-7 days. 11/23/12   Adlih Moreno-Coll, MD  nystatin cream (MYCOSTATIN) Apply topically 2 (two) times daily. 11/23/12   Adlih Moreno-Coll, MD  silver sulfADIAZINE (SILVADENE) 1 % cream Apply topically daily. 11/23/12   Adlih Moreno-Coll, MD   BP 130/77 mmHg  Pulse 78  Temp(Src) 98.1 F (36.7 C) (Oral)  Resp 22  SpO2 97% Physical Exam  Constitutional: She is oriented to person, place, and time. She appears well-developed and well-nourished. No distress.  HENT:  Head: Normocephalic and atraumatic.  Mouth/Throat: Oropharynx is clear and moist.  Eyes: Conjunctivae are normal. Pupils are equal, round, and reactive to light. No scleral icterus.  Neck: Neck supple.  Cardiovascular: Normal rate, regular rhythm, normal heart sounds and intact distal pulses.   No murmur heard. Pulmonary/Chest: Effort normal and breath sounds normal. No stridor. No respiratory distress. She has no rales.  Abdominal: Soft. Bowel sounds are normal. She exhibits no distension. There is no tenderness. There is CVA tenderness (minimal right flank tenderness). There is no rigidity, no rebound and no guarding.  Musculoskeletal: Normal range of motion.  Neurological: She is alert and oriented to person, place, and time.  Skin: Skin is warm and dry. No rash noted.  Psychiatric: She has a normal mood and affect. Her behavior is normal.  Nursing note and vitals reviewed.   ED Course  Procedures (including critical care time) Labs Review Labs Reviewed  CBC WITH DIFFERENTIAL - Abnormal; Notable for the following:    WBC 13.2 (*)    RBC 5.16 (*)  Neutro Abs 10.1 (*)    All other components within normal limits  COMPREHENSIVE METABOLIC PANEL - Abnormal; Notable for the following:    Glucose, Bld 126 (*)    Total Protein 8.4 (*)    Anion gap 18 (*)    All other components within normal limits  URINALYSIS, ROUTINE W REFLEX MICROSCOPIC  LIPASE, BLOOD    Imaging Review Ct Abdomen Pelvis Wo  Contrast  08/26/2014   CLINICAL DATA:  RIGHT flank pain for 24 hr. Pain near the gluteal region.  EXAM: CT ABDOMEN AND PELVIS WITHOUT CONTRAST  TECHNIQUE: Multidetector CT imaging of the abdomen and pelvis was performed following the standard protocol without IV contrast.  COMPARISON:  06/06/2011.  FINDINGS: Musculoskeletal: Lumbar spondylosis. No aggressive osseous lesions. Thoracic DISH.  Lung Bases: Atelectasis.  Liver: Unenhanced CT was performed per clinician order. Lack of IV contrast limits sensitivity and specificity, especially for evaluation of abdominal/pelvic solid viscera. Hepatomegaly is present with 21 cm liver span and rounding of the inferior hepatic margin.  Spleen:  Normal.  Gallbladder:  Normal.  Common bile duct:  Normal.  Pancreas:  Normal.  Adrenal glands:  Normal.  Kidneys: No renal calculi. Both ureters appear within normal limits. No hydronephrosis. Phleboliths are present in the RIGHT gonadal vein.  Stomach:  Partially collapsed.  No inflammatory changes.  Small bowel:  Normal.  Colon: Colonic diverticulosis. No diverticulitis. Normal appendix.  Pelvic Genitourinary: Hysterectomy. No free fluid. Urinary bladder decompressed.  Vasculature: Mild atherosclerosis.  Body Wall: Fat containing wide mouth periumbilical hernia.  IMPRESSION: 1. No acute abnormality. 2. Mild hepatomegaly with 21 cm liver span. Liver size is mildly decreased when compared to 06/06/2011 and the hepatosteatosis has improved.   Electronically Signed   By: Andreas Newport M.D.   On: 08/26/2014 18:20  All radiology studies independently viewed by me.      EKG Interpretation None      MDM   Final diagnoses:  Acute right flank pain    58 year old female presenting with right flank pain. Episodic in nature. It is unclear to the patient what brings on episodes of pain. She is well appearing and nontoxic. No abdominal pain or tenderness. Flank is mildly tender to palpation.  CT obtained and negative.  Labwork  notable only for mild leukocytosis, likely reactive.  Pain controlled with IV morphine.  Unclear etiology of pain, but possibly muscle spasms.  Plan supportive treatment and outpatient follow up.  Return precautions given.  She was advised to discontinue alcohol.      Warnell Forester, MD 08/26/14 2152

## 2014-08-26 NOTE — ED Notes (Signed)
Bed: YT01 Expected date: 08/26/14 Expected time: 4:47 PM Means of arrival: Ambulance Comments: Pain

## 2015-01-23 ENCOUNTER — Emergency Department (HOSPITAL_COMMUNITY)
Admission: EM | Admit: 2015-01-23 | Discharge: 2015-01-23 | Disposition: A | Discharge status: Home / Self Care | Payer: Self-pay | Attending: Emergency Medicine | Admitting: Emergency Medicine

## 2015-01-23 ENCOUNTER — Encounter (HOSPITAL_COMMUNITY): Payer: Self-pay | Admitting: Emergency Medicine

## 2015-01-23 DIAGNOSIS — J3489 Other specified disorders of nose and nasal sinuses: Secondary | ICD-10-CM | POA: Insufficient documentation

## 2015-01-23 DIAGNOSIS — Z79899 Other long term (current) drug therapy: Secondary | ICD-10-CM | POA: Insufficient documentation

## 2015-01-23 DIAGNOSIS — R0989 Other specified symptoms and signs involving the circulatory and respiratory systems: Secondary | ICD-10-CM | POA: Insufficient documentation

## 2015-01-23 DIAGNOSIS — R062 Wheezing: Secondary | ICD-10-CM | POA: Insufficient documentation

## 2015-01-23 DIAGNOSIS — R05 Cough: Secondary | ICD-10-CM | POA: Insufficient documentation

## 2015-01-23 MED ORDER — IPRATROPIUM-ALBUTEROL 0.5-2.5 (3) MG/3ML IN SOLN
3.0000 mL | RESPIRATORY_TRACT | Status: DC
  Administered 2015-01-23: 3 mL via RESPIRATORY_TRACT
  Filled 2015-01-23: qty 3

## 2015-01-23 MED ORDER — DM-GUAIFENESIN ER 30-600 MG PO TB12: 1.0000 | ORAL_TABLET | Freq: Two times a day (BID) | ORAL | Status: DC

## 2015-01-23 MED ORDER — ALBUTEROL SULFATE HFA 108 (90 BASE) MCG/ACT IN AERS
2.0000 | INHALATION_SPRAY | Freq: Once | RESPIRATORY_TRACT | Status: AC
  Administered 2015-01-23: 2 via RESPIRATORY_TRACT
  Filled 2015-01-23: qty 6.7

## 2015-01-23 MED ORDER — IBUPROFEN 800 MG PO TABS
800.0000 mg | ORAL_TABLET | Freq: Once | ORAL | Status: AC
  Administered 2015-01-23: 800 mg via ORAL
  Filled 2015-01-23: qty 1

## 2015-01-23 NOTE — Discharge Instructions (Signed)
You were evaluated in the ED today for your cough and nasal congestion. It is important. Take your medications as prescribed. Please follow-up with your primary care for further evaluation and management of your symptoms. Return to ED for new or worsening symptoms.

## 2015-01-23 NOTE — ED Notes (Signed)
Pt. reports cold symptoms for 2 weeks with nasal congestion , dry cough and runny nose . Denies fever or chills.

## 2015-01-23 NOTE — ED Provider Notes (Signed)
CSN: [id number]     Arrival date & time 01/23/15  0549 History   First MD Initiated Contact with Patient 01/23/15 [phone]     Chief Complaint  Patient presents with  . Nasal Congestion  . Cough     (Consider location/radiation/quality/duration/timing/severity/associated sxs/prior Treatment) HPI Meredith Shah is a 59 y.o. female who comes in for evaluation of nasal congestion, dry cough and runny nose. Patient reports for the past 2 weeks she has "had a cold I can't shake". She has tried Alka-Seltzer and Tylenol without relief of her symptoms. She reports her symptoms are worse when she is trying to sleep and feels like she is wheezing. Denies history of asthma. She denies fevers, chills, nausea or vomiting, chest pain, myalgias, swelling in her legs, hemoptysis, recent travels or surgeries, history of blood clot, oral contraception.  History reviewed. No pertinent past medical history. Past Surgical History  Procedure Laterality Date  . Btl    . Abdominal hysterectomy     No family history on file. History  Substance Use Topics  . Smoking status: Never Smoker   . Smokeless tobacco: Not on file  . Alcohol Use: Yes   OB History    No data available     Review of Systems  Constitutional: Negative for fever.  HENT: Positive for congestion and rhinorrhea.   Respiratory: Positive for cough and wheezing. Negative for shortness of breath.   Cardiovascular: Negative for chest pain and leg swelling.  Skin: Negative for rash.      Allergies  Review of patient's allergies indicates no known allergies.  Home Medications   Prior to Admission medications   Medication Sig Start Date End Date Taking? Authorizing Provider  cyclobenzaprine (FLEXERIL) 5 MG tablet Take 1 tablet (5 mg total) by mouth 3 (three) times daily as needed for muscle spasms. Patient not taking: Reported on 01/23/2015 08/26/14   Blake Divine, MD  dextromethorphan-guaiFENesin [facility] DM) 30-600 MG per 12 hr tablet  Take 1 tablet by mouth 2 (two) times daily. 01/23/15   Joycie Peek, PA-C  erythromycin ophthalmic ointment Place a 1/2 inch ribbon of ointment into the right lower eyelid 3 times a day for 5-7 days. Patient not taking: Reported on 01/23/2015 11/23/12   Sharin Grave, MD  HYDROcodone-acetaminophen (NORCO/VICODIN) 5-325 MG per tablet Take 1-2 tablets by mouth every 6 (six) hours as needed for moderate pain or severe pain. Patient not taking: Reported on 01/23/2015 08/26/14   Blake Divine, MD  nystatin cream (MYCOSTATIN) Apply topically 2 (two) times daily. Patient not taking: Reported on 01/23/2015 11/23/12   Christin Fudge Moreno-Coll, MD  silver sulfADIAZINE (SILVADENE) 1 % cream Apply topically daily. Patient not taking: Reported on 01/23/2015 11/23/12   Adlih Moreno-Coll, MD   BP 128/63 mmHg  Pulse 88  Temp(Src) 99.2 F (37.3 C) (Oral)  Resp 14  SpO2 97% Physical Exam  Constitutional: She is oriented to person, place, and time. She appears well-developed and well-nourished. No distress.  HENT:  Head: Normocephalic and atraumatic.  Mouth/Throat: Oropharynx is clear and moist. No oropharyngeal exudate.  Eyes: Conjunctivae are normal. Pupils are equal, round, and reactive to light. Right eye exhibits no discharge. Left eye exhibits no discharge. No scleral icterus.  Neck: Normal range of motion. Neck supple.  Cardiovascular: Normal rate, regular rhythm and normal heart sounds.   Pulmonary/Chest: Effort normal and breath sounds normal. No stridor. No respiratory distress. She has no wheezes. She has no rales.  Patient is audibly wheezing during conversation but  long auscultation yields no wheezing. No stridor. No other adventitious lung sounds, good air movement bilaterally. No evidence of respiratory distress  Abdominal: Soft. There is no tenderness.  Musculoskeletal: Normal range of motion. She exhibits no edema or tenderness.  Neurological: She is alert and oriented to person, place, and time.   Cranial Nerves II-XII grossly intact  Skin: Skin is warm and dry. No rash noted. She is not diaphoretic.  Psychiatric: She has a normal mood and affect.  Nursing note and vitals reviewed.   ED Course  Procedures (including critical care time) Labs Review Labs Reviewed - No data to display  Imaging Review No results found.   EKG Interpretation None     Meds given in ED:  Medications  ipratropium-albuterol (DUONEB) 0.5-2.5 (3) MG/3ML nebulizer solution 3 mL (3 mLs Nebulization Given 01/23/15 0643)  albuterol (PROVENTIL HFA;VENTOLIN HFA) 108 (90 BASE) MCG/ACT inhaler 2 puff (not administered)  ibuprofen (ADVIL,MOTRIN) tablet 800 mg (800 mg Oral Given 01/23/15 0642)    New Prescriptions   DEXTROMETHORPHAN-GUAIFENESIN (MUCINEX DM) 30-600 MG PER 12 HR TABLET    Take 1 tablet by mouth 2 (two) times daily.   Filed Vitals:   01/23/15 0557 01/23/15 0630 01/23/15 0700  BP: 113/69 102/64 128/63  Pulse: 92 86 88  Temp: 99.2 F (37.3 C)    TempSrc: Oral    Resp: 14 16 14   SpO2: 98% 99% 97%    MDM  Vitals stable - WNL -afebrile Pt resting comfortably in ED. patient states she feels much better after DuoNeb treatments in the ED. PE-benign lung exam.  DDX--patient with cough and chest/nasal congestion likely secondary to viral syndrome. Will treat with cough suppressants, bronchodilators at home. Instructions to return if symptoms worsen or she experiences fevers, chills. Nonproductive cough without fever and benign lung exam, doubt pneumonia. No evidence of CHF, doubt PE, influenza. No evidence of other acute or emergent pathology at this time  I discussed all relevant lab findings and imaging results with pt and they verbalized understanding. Discussed f/u with PCP within 48 hrs and return precautions, pt very amenable to plan.  Final diagnoses:  Chest congestion        Joycie Peek, PA-C 01/23/15 1115  Elwin Mocha, MD 01/23/15 [id number]

## 2015-11-21 ENCOUNTER — Encounter: Payer: Self-pay | Admitting: Internal Medicine

## 2015-12-11 ENCOUNTER — Emergency Department (HOSPITAL_COMMUNITY): Payer: Self-pay

## 2015-12-11 ENCOUNTER — Encounter (HOSPITAL_COMMUNITY): Payer: Self-pay | Admitting: Emergency Medicine

## 2015-12-11 ENCOUNTER — Emergency Department (HOSPITAL_COMMUNITY)
Admission: EM | Admit: 2015-12-11 | Discharge: 2015-12-11 | Disposition: A | Discharge status: Home / Self Care | Payer: Self-pay | Attending: Physician Assistant | Admitting: Physician Assistant

## 2015-12-11 DIAGNOSIS — R42 Dizziness and giddiness: Secondary | ICD-10-CM | POA: Insufficient documentation

## 2015-12-11 DIAGNOSIS — J111 Influenza due to unidentified influenza virus with other respiratory manifestations: Secondary | ICD-10-CM | POA: Insufficient documentation

## 2015-12-11 DIAGNOSIS — Z79899 Other long term (current) drug therapy: Secondary | ICD-10-CM | POA: Insufficient documentation

## 2015-12-11 LAB — URINALYSIS, ROUTINE W REFLEX MICROSCOPIC
Bilirubin Urine: NEGATIVE
Glucose, UA: NEGATIVE mg/dL
Hgb urine dipstick: NEGATIVE
Ketones, ur: NEGATIVE mg/dL
Leukocytes, UA: NEGATIVE
Nitrite: NEGATIVE
PH: 7 (ref 5.0–8.0)
Protein, ur: NEGATIVE mg/dL
SPECIFIC GRAVITY, URINE: 1.019 (ref 1.005–1.030)

## 2015-12-11 LAB — BRAIN NATRIURETIC PEPTIDE: B Natriuretic Peptide: 122.9 pg/mL — ABNORMAL HIGH (ref 0.0–100.0)

## 2015-12-11 LAB — CBC WITH DIFFERENTIAL/PLATELET
Basophils Absolute: 0 10*3/uL (ref 0.0–0.1)
Basophils Relative: 0 %
EOS ABS: 0.1 10*3/uL (ref 0.0–0.7)
Eosinophils Relative: 1 %
HCT: 41.3 % (ref 36.0–46.0)
Hemoglobin: 13.8 g/dL (ref 12.0–15.0)
LYMPHS ABS: 1.2 10*3/uL (ref 0.7–4.0)
Lymphocytes Relative: 15 %
MCH: 27.9 pg (ref 26.0–34.0)
MCHC: 33.4 g/dL (ref 30.0–36.0)
MCV: 83.4 fL (ref 78.0–100.0)
MONOS PCT: 7 %
Monocytes Absolute: 0.6 10*3/uL (ref 0.1–1.0)
Neutro Abs: 6 10*3/uL (ref 1.7–7.7)
Neutrophils Relative %: 77 %
Platelets: 154 10*3/uL (ref 150–400)
RBC: 4.95 MIL/uL (ref 3.87–5.11)
RDW: 12.8 % (ref 11.5–15.5)
WBC: 7.8 10*3/uL (ref 4.0–10.5)

## 2015-12-11 LAB — I-STAT CG4 LACTIC ACID, ED
LACTIC ACID, VENOUS: 2.68 mmol/L — AB (ref 0.5–2.0)
LACTIC ACID, VENOUS: 1.81 mmol/L (ref 0.5–2.0)

## 2015-12-11 LAB — BASIC METABOLIC PANEL
Anion gap: 16 — ABNORMAL HIGH (ref 5–15)
BUN: 7 mg/dL (ref 6–20)
CHLORIDE: 104 mmol/L (ref 101–111)
CO2: 23 mmol/L (ref 22–32)
CREATININE: 0.98 mg/dL (ref 0.44–1.00)
Calcium: 8.7 mg/dL — ABNORMAL LOW (ref 8.9–10.3)
GFR calc Af Amer: >60 mL/min (ref >60)
GFR calc non Af Amer: >60 mL/min (ref >60)
Glucose, Bld: 143 mg/dL — ABNORMAL HIGH (ref 65–99)
Potassium: 3.4 mmol/L — ABNORMAL LOW (ref 3.5–5.1)
Sodium: 143 mmol/L (ref 135–145)

## 2015-12-11 LAB — INFLUENZA PANEL BY PCR (TYPE A & B)
H1N1FLUPCR: NOT DETECTED
Influenza A By PCR: POSITIVE — AB
Influenza B By PCR: NEGATIVE

## 2015-12-11 LAB — I-STAT TROPONIN, ED: TROPONIN I, POC: 0.03 ng/mL (ref 0.00–0.08)

## 2015-12-11 MED ORDER — ALBUTEROL SULFATE (2.5 MG/3ML) 0.083% IN NEBU
5.0000 mg | INHALATION_SOLUTION | Freq: Once | RESPIRATORY_TRACT | Status: AC
  Administered 2015-12-11: 5 mg via RESPIRATORY_TRACT
  Filled 2015-12-11: qty 6

## 2015-12-11 MED ORDER — IPRATROPIUM BROMIDE 0.02 % IN SOLN
0.5000 mg | Freq: Once | RESPIRATORY_TRACT | Status: AC
  Administered 2015-12-11: 0.5 mg via RESPIRATORY_TRACT
  Filled 2015-12-11: qty 2.5

## 2015-12-11 MED ORDER — SODIUM CHLORIDE 0.9 % IV BOLUS (SEPSIS)
500.0000 mL | Freq: Once | INTRAVENOUS | Status: AC
  Administered 2015-12-11: 500 mL via INTRAVENOUS

## 2015-12-11 MED ORDER — OSELTAMIVIR PHOSPHATE 75 MG PO CAPS
75.0000 mg | ORAL_CAPSULE | Freq: Two times a day (BID) | ORAL | Status: DC
Start: 2015-12-11 — End: 2016-01-17

## 2015-12-11 MED ORDER — ACETAMINOPHEN 500 MG PO TABS
1000.0000 mg | ORAL_TABLET | Freq: Once | ORAL | Status: AC
  Administered 2015-12-11: 1000 mg via ORAL
  Filled 2015-12-11: qty 2

## 2015-12-11 NOTE — Discharge Instructions (Signed)
1. Medications: tamiflu, usual home medications 2. Treatment: rest, drink plenty of fluids  3. Follow Up: please followup with your primary doctor for discussion of your diagnoses and further evaluation after today's visit; if you do not have a primary care doctor use the resource guide provided to find one; please return to the ER for new or worsening symptoms   Influenza, Adult Influenza ("the flu") is a viral infection of the respiratory tract. It occurs more often in winter months because people spend more time in close contact with one another. Influenza can make you feel very sick. Influenza easily spreads from person to person (contagious). CAUSES  Influenza is caused by a virus that infects the respiratory tract. You can catch the virus by breathing in droplets from an infected person's cough or sneeze. You can also catch the virus by touching something that was recently contaminated with the virus and then touching your mouth, nose, or eyes. RISKS AND COMPLICATIONS You may be at risk for a more severe case of influenza if you smoke cigarettes, have diabetes, have chronic heart disease (such as heart failure) or lung disease (such as asthma), or if you have a weakened immune system. Elderly people and pregnant women are also at risk for more serious infections. The most common problem of influenza is a lung infection (pneumonia). Sometimes, this problem can require emergency medical care and may be life threatening. SIGNS AND SYMPTOMS  Symptoms typically last 4 to 10 days and may include:  Fever.  Chills.  Headache, body aches, and muscle aches.  Sore throat.  Chest discomfort and cough.  Poor appetite.  Weakness or feeling tired.  Dizziness.  Nausea or vomiting. DIAGNOSIS  Diagnosis of influenza is often made based on your history and a physical exam. A nose or throat swab test can be done to confirm the diagnosis. TREATMENT  In mild cases, influenza goes away on its own.  Treatment is directed at relieving symptoms. For more severe cases, your health care provider may prescribe antiviral medicines to shorten the sickness. Antibiotic medicines are not effective because the infection is caused by a virus, not by bacteria. HOME CARE INSTRUCTIONS  Take medicines only as directed by your health care provider.  Use a cool mist humidifier to make breathing easier.  Get plenty of rest until your temperature returns to normal. This usually takes 3 to 4 days.  Drink enough fluid to keep your urine clear or pale yellow.  Cover yourmouth and nosewhen coughing or sneezing,and wash your handswellto prevent thevirusfrom spreading.  Stay homefromwork orschool untilthe fever is gonefor at least 97full day. PREVENTION  An annual influenza vaccination (flu shot) is the best way to avoid getting influenza. An annual flu shot is now routinely recommended for all adults in the [place] SEEK MEDICAL CARE IF:  You experiencechest pain, yourcough worsens,or you producemore mucus.  Youhave nausea,vomiting, ordiarrhea.  Your fever returns or gets worse. SEEK IMMEDIATE MEDICAL CARE IF:  You havetrouble breathing, you become short of breath,or your skin ornails becomebluish.  You have severe painor stiffnessin the neck.  You develop a sudden headache, or pain in the face or ear.  You have nausea or vomiting that you cannot control. MAKE SURE YOU:   Understand these instructions.  Will watch your condition.  Will get help right away if you are not doing well or get worse.   This information is not intended to replace advice given to you by your health care provider. Make sure you  discuss any questions you have with your health care provider.   Document Released: 09/25/2000 Document Revised: 10/19/2014 Document Reviewed: 12/28/2011 Elsevier Interactive Patient Education 2016 [employer].   Emergency Department Resource Guide 1) Find a Doctor and  Pay Out of Pocket Although you won't have to find out who is covered by your insurance plan, it is a good idea to ask around and get recommendations. You will then need to call the office and see if the doctor you have chosen will accept you as a new patient and what types of options they offer for patients who are self-pay. Some doctors offer discounts or will set up payment plans for their patients who do not have insurance, but you will need to ask so you aren't surprised when you get to your appointment.  2) Contact Your Local Health Department Not all health departments have doctors that can see patients for sick visits, but many do, so it is worth a call to see if yours does. If you don't know where your local health department is, you can check in your phone book. The CDC also has a tool to help you locate your state's health department, and many state websites also have listings of all of their local health departments.  3) Find a Walk-in Clinic If your illness is not likely to be very severe or complicated, you may want to try a walk in clinic. These are popping up all over the country in pharmacies, drugstores, and shopping centers. They're usually staffed by nurse practitioners or physician assistants that have been trained to treat common illnesses and complaints. They're usually fairly quick and inexpensive. However, if you have serious medical issues or chronic medical problems, these are probably not your best option.  No Primary Care Doctor: - Call Health Connect at  [phone] - they can help you locate a primary care doctor that  accepts your insurance, provides certain services, etc. - Physician Referral Service- [phone]  Chronic Pain Problems: Organization         Address  Phone   Notes  [facility]  [phone] Patients need to be referred by their primary care doctor.   Medication Assistance: Organization         Address  Phone   Notes  [facility] Medication [facility] [address] [place] [place], [place] [address] [phone] --Must be a resident of [facility] -- Must have NO insurance coverage whatsoever (no Medicaid/ Medicare, etc.) -- The pt. MUST have a primary care doctor that directs their care regularly and follows them in the community   [facility]  [phone]   [employer]  [phone]    Agencies that provide inexpensive medical care: Organization         Address  Phone   Notes  [facility]  [phone]   [facility]    [phone]   [facility] [address] [place], [place] [address] [phone]   [facility] [address] [place] [place] [phone]   Planned Parenthood    [phone]   [facility]    [phone]   [facility]  [address], [place] Phone:  [phone], Fax:  [phone] Hours of Operation:  9 am - 6 pm, M-F.  Also accepts Medicaid/Medicare and self-pay.  [facility]  [address], [place] Phone: [phone], Fax: [phone]. Hours of Operation:  8:30 am - 5:30 pm, M-F.  Also accepts Medicaid and self-pay.  [facility] [address] [place] [address], [place] Phone: [phone]   [facility] [address] Natasha Bence [place], [place] [phone], Ext. [phone] Mondays & Thursdays: 7-9 AM.  First 15 patients are seen on a first come, first serve basis.    Medicaid-accepting [facility] Providers:  Organization         Address  Phone   Notes  [facility] [address], [place] [phone] Also accepts self-pay patients.  [facility] [address] Laurell Josephs [place], [place]  [phone]   [facility] [name], [place] [phone]   [facility] [address], [place] [phone]   Renaye Rakers [address], [place]   [phone] Only accepts [place] patients after they have their name applied to their card.   Self-Pay (no insurance) in [facility]:  Organization         Address  Phone   Notes  Sickle Cell Patients, [facility] Internal Medicine [address] [place], [place] [phone]   [facility] Urgent Care [address] [place], [place] [phone]   [facility] [place]  [address], [place] [phone]   [facility]/Dr. Osei-Bonsu  [address], [place] or [address], [place] [phone] Phone number for both [place] and [place] locations is the same.  [facility]gent Medical and [facility] [address] [place] [phone]   [facility] [address] [place] or [address] [phone]   [facility] [address], [place] [phone], phone; [phone], fax Sees patients 1st and 3rd Saturday of every month.  Must not qualify for public or private insurance (i.e. Medicaid, Medicare, [place] Health Choice, Veterans' Benefits)  Household income should be no more than 200% of the poverty level The clinic cannot treat you if you are pregnant or think you are pregnant  Sexually transmitted diseases are not treated at the clinic.    Dental Care: Organization         Address  Phone  Notes  [facility] [address] [place], [place] [phone] Accepts children up to age 44 who are enrolled in [place] or [place] Health Choice; pregnant women with a Medicaid card; and children who have applied for Medicaid or [place] Health Choice, but were declined, whose parents can pay a reduced fee at time of service.  [facility]  [address], [place] [phone] Accepts children up to age 51 who are enrolled in [place] or [place] Health Choice; pregnant women with a Medicaid card; and children who have applied for Medicaid or [place] Health Choice, but were declined, whose parents can pay a reduced fee at time of service.  [facility]  [address] [place], [place] [phone] Patients are seen by appointment only. Walk-ins are not accepted. [facility] will see patients 39 years of age and older. Monday - Tuesday (8am-5pm) Most Wednesdays (8:30-5pm) $30 per visit, cash only  [employer] [facility] [employer]  [address], [place] [phone] Patients are seen by appointment only. Walk-ins are not accepted. [facility] will see patients 67 years of age and older. One Wednesday Evening (Monthly: Volunteer Based).  $30 per visit, cash only  [employer]  [phone] for adults; Children under age 66, call Graduate Pediatric Dentistry at [phone]. Children aged 40-14, please call [phone] to request a pediatric application.  Dental services are provided in all areas of dental care including fillings, crowns and bridges, complete and partial dentures, implants, gum treatment, root canals, and extractions. Preventive care is also provided. Treatment is provided to both adults and children. Patients are selected via a lottery and there is often a waiting list.   [facility] [address], [place]  [phone] [email or web address]   [facility] [address] [place], [place] [phone], Ext. [phone] Second and Fourth Thursday of each month, opens at 6:30 AM; Clinic ends at 9 AM.  Patients are seen on a first-come first-served basis, and a limited number are seen during each clinic.   [facility]  [address] Ether [place]ins [place], [place] [phone]   Eligibility Requirements You must have lived in [place], [place], or  [place] counties for at least the last three months.   You cannot be eligible for state or federal sponsored [place], including [employer], [place], or [employer].   You generally cannot be eligible for healthcare insurance through your employer.    How to apply: Eligibility screenings are held every Tuesday and Wednesday afternoon from 1:00 pm until 4:00 pm. You do not need an appointment for the interview!  [facility] [address], [place], [place] [phone]   [facility] Health Department  [phone]   [facility] Health Department  [phone]   [facility] Health Department  [phone]    Behavioral Health Resources in the Community: Intensive Outpatient Programs Organization         Address  Phone  Notes  [facility] Services [address], [place], [place] [phone]   [facility] Outpatient [address] [place], [place] [phone]   [facility] [address], [place], [place]  [phone]   [facility] Health [address]  [place], [place] [phone] or [phone]   Substance Abuse Resources Organization         Address  Phone  Notes  [facility]  [phone]   [facility]  [phone]   [facility]  [phone]   Floydene Flock  [phone]   Residential & Outpatient Substance Abuse Program  [phone]   Psychological Services Organization         Address  Phone  Notes  [facility]  [phone]   [facility]  [phone]   [facility] Mental Health [address], [place] [phone] or [phone]    Mobile Crisis Teams Organization         Address  Phone  Notes  [facility], Mobile Crisis Care Unit  [phone]   [facility]  [name] [place], [place] [phone]   Doristine Locks [address] [place] [phone]    Self-Help/Support Groups Organization         Address  Phone             Notes  [facility] [place] -  variety of support groups  [phone] Call for more information  Narcotics Anonymous (NA), [facility] [address], [place]  2 meetings at this location   Residential [facility] administratorTreatment Programs Organization         Address  Phone  Notes  [facility] [address],    [place]  [phone]   [facility]  [address], [place] [phone], [place], [place] [phone]   [facility] [address], [place] [phone] Admissions: 8am-3pm M-F  [facility] [address]    [place], [place] [phone]   [facility] [address], [place], [place] [phone]   [facility] [address]  [place], [place] [phone]   [facility] - Intensive Outpatient [address], [place], [place] [phone]   [facility] [address]  [place], [place] [phone] or [phone]   [facility] ([facility]) [address] [place], [place] [phone] Accepts Medicaid  [facility] [address]  [place] [phone] Substance Abuse/Addiction Treatment   [facility] Behavioral Health Resources Organization         Address  Phone  Notes  [facility]  [phone]) [phone]   Angie FavaJulie Brannon, PhD [address] [place], [place]   [phone](336) [phone] or [phone](336) [phone]   [facility]   [address] [place], [place] [phone]) [phone]   [facility] [address], [place], [place] [phone](336) [phone] Insurance/Medicaid/sponsorship through [facility]  [facility] [name]                                    [place], [place] [phone]) [phone] Therapy/tele-psych/case  [facility] [address]   [place], [place] [phone]     Dr. Lolly MustacheArfeen  [phone](336) [phone]   [facility] [place]  [employer] [facility] 1) [address], [place] 2) [address], [place] 3)  [address], [place] [phone]) [phone]  [phone](336) [phone]   [facility] Child Abuse Hotline [phone](336) 2030972704 or [phone](336) [phone] (After Hours)

## 2015-12-11 NOTE — ED Provider Notes (Signed)
CSN: [id number]     Arrival date & time 12/11/15  0554 History   First MD Initiated Contact with Patient 12/11/15 0557     Chief Complaint  Patient presents with  . Shortness of Breath    HPI   Meredith Shah is a 60 y.o. female with a PMH of HTN who presents to the ED with nasal congestion, cough, and shortness of breath. She notes sick contact yesterday, and states her symptoms started this morning. She denies exacerbating or alleviating factors. She reports associated lightheadedness. She denies fever, chills, chest pain, abdominal pain, nausea, vomiting, recent travel or immobility, recent surgery, history of malignancy, history of DVT or PE, estrogen use, tobacco use.   History reviewed. No pertinent past medical history. Past Surgical History  Procedure Laterality Date  . Btl    . Abdominal hysterectomy     No family history on file. Social History  Substance Use Topics  . Smoking status: Never Smoker   . Smokeless tobacco: None  . Alcohol Use: Yes   OB History    No data available      Review of Systems  Constitutional: Negative for fever and chills.  HENT: Positive for congestion.   Respiratory: Positive for cough and shortness of breath.   Cardiovascular: Negative for chest pain and leg swelling.  Gastrointestinal: Negative for nausea, vomiting, abdominal pain, diarrhea and constipation.  Neurological: Positive for light-headedness. Negative for dizziness, syncope, weakness and numbness.  All other systems reviewed and are negative.     Allergies  Review of patient's allergies indicates no known allergies.  Home Medications   Prior to Admission medications   Medication Sig Start Date End Date Taking? Authorizing Provider  triamterene-hydrochlorothiazide (DYAZIDE) 37.5-25 MG capsule Take 1 capsule by mouth every morning. 11/19/15  Yes Historical Provider, MD  cyclobenzaprine (FLEXERIL) 5 MG tablet Take 1 tablet (5 mg total) by mouth 3 (three) times daily as  needed for muscle spasms. Patient not taking: Reported on 01/23/2015 08/26/14   Blake Divine, MD  dextromethorphan-guaiFENesin [facility] DM) 30-600 MG per 12 hr tablet Take 1 tablet by mouth 2 (two) times daily. Patient not taking: Reported on 12/11/2015 01/23/15   Joycie Peek, PA-C  erythromycin ophthalmic ointment Place a 1/2 inch ribbon of ointment into the right lower eyelid 3 times a day for 5-7 days. Patient not taking: Reported on 01/23/2015 11/23/12   Sharin Grave, MD  HYDROcodone-acetaminophen (NORCO/VICODIN) 5-325 MG per tablet Take 1-2 tablets by mouth every 6 (six) hours as needed for moderate pain or severe pain. Patient not taking: Reported on 01/23/2015 08/26/14   Blake Divine, MD  nystatin cream (MYCOSTATIN) Apply topically 2 (two) times daily. Patient not taking: Reported on 01/23/2015 11/23/12   Sharin Grave, MD  oseltamivir (TAMIFLU) 75 MG capsule Take 1 capsule (75 mg total) by mouth every 12 (twelve) hours. 12/11/15   Mady Gemma, PA-C  silver sulfADIAZINE (SILVADENE) 1 % cream Apply topically daily. Patient not taking: Reported on 01/23/2015 11/23/12   Adlih Moreno-Coll, MD    BP 101/54 mmHg  Pulse 104  Temp(Src) 99.3 F (37.4 C) (Oral)  Resp 21  SpO2 94% Physical Exam  Constitutional: She is oriented to person, place, and time. She appears well-developed and well-nourished. No distress.  HENT:  Head: Normocephalic and atraumatic.  Right Ear: External ear normal.  Left Ear: External ear normal.  Nose: Nose normal.  Mouth/Throat: Uvula is midline, oropharynx is clear and moist and mucous membranes are normal.  Eyes:  Conjunctivae, EOM and lids are normal. Pupils are equal, round, and reactive to light. Right eye exhibits no discharge. Left eye exhibits no discharge. No scleral icterus.  Neck: Normal range of motion. Neck supple.  Cardiovascular: Normal rate, regular rhythm, normal heart sounds, intact distal pulses and normal pulses.   Pulmonary/Chest:  Effort normal. No respiratory distress. She has wheezes. She has no rales.  Diffuse wheezing to lung fields bilaterally. No respiratory distress.  Abdominal: Soft. Normal appearance and bowel sounds are normal. She exhibits no distension and no mass. There is no tenderness. There is no rigidity, no rebound and no guarding.  Musculoskeletal: Normal range of motion. She exhibits no edema or tenderness.  Neurological: She is alert and oriented to person, place, and time. She has normal strength. No sensory deficit.  Skin: Skin is warm, dry and intact. No rash noted. She is not diaphoretic. No erythema. No pallor.  Psychiatric: She has a normal mood and affect. Her speech is normal and behavior is normal.  Nursing note and vitals reviewed.   ED Course  Procedures (including critical care time)  Labs Review Labs Reviewed  BASIC METABOLIC PANEL - Abnormal; Notable for the following:    Potassium 3.4 (*)    Glucose, Bld 143 (*)    Calcium 8.7 (*)    Anion gap 16 (*)    All other components within normal limits  BRAIN NATRIURETIC PEPTIDE - Abnormal; Notable for the following:    B Natriuretic Peptide 122.9 (*)    All other components within normal limits  URINALYSIS, ROUTINE W REFLEX MICROSCOPIC (NOT AT [facility]) - Abnormal; Notable for the following:    Color, Urine AMBER (*)    APPearance HAZY (*)    All other components within normal limits  INFLUENZA PANEL BY PCR (TYPE A & B, H1N1) - Abnormal; Notable for the following:    Influenza A By PCR POSITIVE (*)    All other components within normal limits  I-STAT CG4 LACTIC ACID, ED - Abnormal; Notable for the following:    Lactic Acid, Venous 2.68 (*)    All other components within normal limits  CBC WITH DIFFERENTIAL/PLATELET  I-STAT TROPOININ, ED  I-STAT CG4 LACTIC ACID, ED  I-STAT CG4 LACTIC ACID, ED    Imaging Review Dg Chest 2 View  12/11/2015  CLINICAL DATA:  Acute onset of shortness of breath and cough. Difficulty breathing. Initial  encounter. EXAM: CHEST  2 VIEW COMPARISON:  Chest radiograph performed 12/05/2015 FINDINGS: The lungs are well-aerated. Vascular congestion is noted. Increased interstitial markings raise concern for mild interstitial edema. There is no evidence of pleural effusion or pneumothorax. The heart is normal in size; the mediastinal contour is within normal limits. No acute osseous abnormalities are seen. IMPRESSION: Vascular congestion noted. Increased interstitial markings raise concern for mild interstitial edema. Electronically Signed   By: Roanna Raider M.D.   On: 12/11/2015 07:04    I have personally reviewed and evaluated these images and lab results as part of my medical decision-making.   EKG Interpretation None      MDM   Final diagnoses:  Influenza    60 year old female presents with nasal congestion, cough, and shortness of breath. Denies fever, chills, chest pain, abdominal pain, nausea, vomiting, recent travel or immobility, recent surgery, history of malignancy, history of DVT or PE, estrogen use, tobacco use.  Patient's temp initially 99.8. Mildly tachycardic. Heart regular rhythm. Lungs with wheezing bilaterally. No increased work of breathing or respiratory distress.  Abdomen soft, nontender, nondistended. No lower extremity edema.  EKG sinus tachycardia, heart rate 103. Troponin negative. CBC negative for leukocytosis or anemia. UA negative for infection.  CXR remarkable for vascular congestion, increased interstitial markings concerning for mild interstitial edema.  On reassessment of patient, wheezing resolved, lungs clear bilaterally. Notified patient's temp now 101.9. Lactic acid 2.68. Will give fluids and tylenol.  Flu panel positive for influenza A. Discussed findings with patient.  Patient's lactic acid improved to 1.81 s/p fluid administration. Patient is non-toxic appearing, feel she is stable for discharge at this time. Will treat with tamiflu given onset within 48  hours. Patient to follow-up with PCP. Strict return precautions discussed. Patient verbalizes her understanding and is in agreement with plan. Patient discussed with and seen by Dr. Corlis Leak.  BP 101/54 mmHg  Pulse 104  Temp(Src) 99.3 F (37.4 C) (Oral)  Resp 21  SpO2 94%      Mady Gemma, PA-C 12/11/15 1534  Courteney Randall An, MD 12/11/15 1603

## 2015-12-11 NOTE — ED Notes (Signed)
Pt c/o SOB since yesterday morning using inhalers with not relief, denies pain, fever or chills, states she is been having some nausea.

## 2015-12-11 NOTE — ED Notes (Signed)
Patient transported to X-ray 

## 2016-01-17 ENCOUNTER — Ambulatory Visit (AMBULATORY_SURGERY_CENTER): Payer: Self-pay | Admitting: *Deleted

## 2016-01-17 VITALS — Ht 66.0 in | Wt 275.6 lb

## 2016-01-17 DIAGNOSIS — Z1211 Encounter for screening for malignant neoplasm of colon: Secondary | ICD-10-CM

## 2016-01-17 MED ORDER — SUPREP BOWEL PREP KIT 17.5-3.13-1.6 GM/177ML PO SOLN: 1.0000 | Freq: Once | ORAL | Status: DC

## 2016-01-17 NOTE — Progress Notes (Signed)
Patient denies any allergies to egg or soy products. Patient denies complications with anesthesia/sedation.  Patient denies oxygen use at home and denies diet medications. Emmi instructions for colonoscopy explained but patient denied.     

## 2016-01-31 ENCOUNTER — Encounter: Payer: PRIVATE HEALTH INSURANCE | Admitting: Internal Medicine

## 2016-11-23 ENCOUNTER — Emergency Department (HOSPITAL_COMMUNITY)
Admission: EM | Admit: 2016-11-23 | Discharge: 2016-11-23 | Disposition: A | Discharge status: Home / Self Care | Payer: PRIVATE HEALTH INSURANCE | Attending: Emergency Medicine | Admitting: Emergency Medicine

## 2016-11-23 ENCOUNTER — Encounter (HOSPITAL_COMMUNITY): Payer: Self-pay | Admitting: Emergency Medicine

## 2016-11-23 DIAGNOSIS — Z79899 Other long term (current) drug therapy: Secondary | ICD-10-CM | POA: Insufficient documentation

## 2016-11-23 DIAGNOSIS — L0291 Cutaneous abscess, unspecified: Secondary | ICD-10-CM

## 2016-11-23 DIAGNOSIS — I10 Essential (primary) hypertension: Secondary | ICD-10-CM | POA: Insufficient documentation

## 2016-11-23 DIAGNOSIS — L02412 Cutaneous abscess of left axilla: Secondary | ICD-10-CM | POA: Insufficient documentation

## 2016-11-23 MED ORDER — LIDOCAINE-EPINEPHRINE (PF) 2 %-1:200000 IJ SOLN
10.0000 mL | Freq: Once | INTRAMUSCULAR | Status: AC
  Administered 2016-11-23: 10 mL
  Filled 2016-11-23: qty 20

## 2016-11-23 MED ORDER — DOXYCYCLINE HYCLATE 100 MG PO CAPS: 100.0000 mg | ORAL_CAPSULE | Freq: Two times a day (BID) | ORAL | 0 refills | Status: DC

## 2016-11-23 NOTE — ED Triage Notes (Signed)
Pt sts left arm axillary abscess with pain and swelling

## 2016-11-23 NOTE — Discharge Instructions (Signed)
Return it 2 days as discussed to  have the packing removed

## 2016-11-23 NOTE — ED Provider Notes (Signed)
[place] DEPT Provider Note   CSN: [id number] Arrival date & time: 11/23/16  4431  By signing my name below, I, Higinio Plan, attest that this documentation has been prepared under the direction and in the presence of Glendell Docker, NP . Electronically Signed: Higinio Plan, Scribe. 11/23/2016. 9:43 AM.  History   Chief Complaint Chief Complaint  Patient presents with  . Abscess   The history is provided by the patient. No language interpreter was used.   HPI Comments: Meredith Shah is a 61 y.o. female with PMHx of HTN and HLD, who presents to the Emergency Department complaining of a painful "boil" to her left axillary that appeared ~3 days ago. Pt reports she has experienced similar symptoms before in which she had to have them incised and drained. She denies any drainage from the site.   Past Medical History:  Diagnosis Date  . Arthritis    knee  . Hyperlipidemia    diet controlled, no meds  . Hypertension   . SVD (spontaneous vaginal delivery)    x 4   There are no active problems to display for this patient.  Past Surgical History:  Procedure Laterality Date  . ABDOMINAL HYSTERECTOMY    . BREAST SURGERY     reduction  . btl    . KNEE SURGERY Right     OB History    No data available     Home Medications    Prior to Admission medications   Medication Sig Start Date End Date Taking? Authorizing Provider  SUPREP BOWEL PREP SOLN Take 1 kit by mouth once. Brand Name Only.  No Substitutions.  Suprep Bowel Kit. 01/17/16   Irene Shipper, MD  triamterene-hydrochlorothiazide (DYAZIDE) 37.5-25 MG capsule Take 1 capsule by mouth every morning. 11/19/15   Historical Provider, MD    Family History Family History  Problem Relation Age of Onset  . Throat cancer Father   . Colon cancer Neg Hx   . Colon polyps Neg Hx   . Rectal cancer Neg Hx   . Stomach cancer Neg Hx     Social History Social History  Substance Use Topics  . Smoking status: Never Smoker  .  Smokeless tobacco: Never Used  . Alcohol use 4.2 oz/week    7 Standard drinks or equivalent per week     Comment: beer/wine and liquor   Allergies   Patient has no known allergies.  Review of Systems Review of Systems  Constitutional: Negative for fever.  Skin:       +abscess to left axillary    Physical Exam Updated Vital Signs BP 135/84 (BP Location: Left Arm)   Pulse 100   Temp 98.5 F (36.9 C) (Oral)   Resp 18   SpO2 96%   Physical Exam  Constitutional: She is oriented to person, place, and time. She appears well-developed and well-nourished.  HENT:  Head: Normocephalic.  Eyes: EOM are normal.  Neck: Normal range of motion.  Cardiovascular: Normal rate.   Pulmonary/Chest: Effort normal.  Abdominal: She exhibits no distension.  Musculoskeletal: Normal range of motion.  Neurological: She is alert and oriented to person, place, and time.  Skin:  Large red fluctuant are to the left axilla.   Psychiatric: She has a normal mood and affect.  Nursing note and vitals reviewed.  ED Treatments / Results  DIAGNOSTIC STUDIES:  Oxygen Saturation is 96% on RA, normal by my interpretation.    COORDINATION OF CARE:  9:41 AM Discussed treatment plan  with pt at bedside and pt agreed to plan.  Labs (all labs ordered are listed, but only abnormal results are displayed) Labs Reviewed - No data to display  EKG  EKG Interpretation None      Radiology No results found.  Procedures Procedures (including critical care time) INCISION AND DRAINAGE PROCEDURE NOTE: Patient identification was confirmed and verbal consent was obtained. This procedure was performed by Glendell Docker, NP at 9:50 AM. Site: Left Axillary   Sterile procedures observed Needle size: 27  Anesthetic used (type and amt): 2% Lidocaine with Epi   Blade size: 11  Drainage: Copious purulent drainage  Complexity: Complex Packing used: 0.5 inch  Site anesthetized, incision made over site, wound drained  and explored loculations, rinsed with copious amounts of normal saline, wound packed with sterile gauze, covered with dry, sterile dressing.  Pt tolerated procedure well without complications.  Instructions for care discussed verbally and pt provided with additional written instructions for homecare and f/u.  Medications Ordered in ED Medications - No data to display  Initial Impression / Assessment and Plan / ED Course  I have reviewed the triage vital signs and the nursing notes.  Pertinent labs & imaging results that were available during my care of the patient were reviewed by me and considered in my medical decision making (see chart for details).     Wound I&D with large amount of drainage. Will have return in 2 days for packing removal. Will treat with doxy  I personally performed the services described in this documentation, which was scribed in my presence. The recorded information has been reviewed and is accurate.   Final Clinical Impressions(s) / ED Diagnoses   Final diagnoses:  None    New Prescriptions New Prescriptions   No medications on file     Glendell Docker, NP 11/23/16 [place], MD 11/23/16 1624

## 2016-11-23 NOTE — ED Notes (Signed)
ED Provider at bedside. 

## 2016-11-25 ENCOUNTER — Encounter (HOSPITAL_COMMUNITY): Payer: Self-pay

## 2016-11-25 ENCOUNTER — Emergency Department (HOSPITAL_COMMUNITY)
Admission: EM | Admit: 2016-11-25 | Discharge: 2016-11-25 | Disposition: A | Discharge status: Home / Self Care | Payer: BLUE CROSS/BLUE SHIELD | Attending: Emergency Medicine | Admitting: Emergency Medicine

## 2016-11-25 DIAGNOSIS — Z79899 Other long term (current) drug therapy: Secondary | ICD-10-CM | POA: Diagnosis not present

## 2016-11-25 DIAGNOSIS — I10 Essential (primary) hypertension: Secondary | ICD-10-CM | POA: Insufficient documentation

## 2016-11-25 DIAGNOSIS — Z4801 Encounter for change or removal of surgical wound dressing: Secondary | ICD-10-CM | POA: Diagnosis present

## 2016-11-25 DIAGNOSIS — Z5189 Encounter for other specified aftercare: Secondary | ICD-10-CM

## 2016-11-25 NOTE — Discharge Instructions (Signed)
Continue to press/milk any discharge out of the incision.  Try to keep the packing in place until Saturday.  You may pull this out yourself.  Follow-up with your doctor in 1 week.  Continue taking antibiotics.  You may apply a warm wash rag to the area 3x a day for 20 minutes.  Return if you have worsening symptoms, fevers, or vomiting.

## 2016-11-25 NOTE — ED Provider Notes (Signed)
[place] DEPT Provider Note   CSN: [id number] Arrival date & time: 11/25/16  0901  By signing my name below, I, Neta Mends, attest that this documentation has been prepared under the direction and in the presence of Montine Circle, [place]. Electronically Signed: Neta Mends, ED Scribe. 11/25/2016. 9:37 AM.   History   Chief Complaint Chief Complaint  Patient presents with  . Wound Check   The history is provided by the patient. No language interpreter was used.   HPI Comments:  Meredith Shah is a 61 y.o. female who presents to the Emergency Department, here for a wound check for an I&D 2 days ago. Pt was seen here on 11/23/2016 for an abscess to her left axillary that had been present for 3 days. The wound was packed, and no sutures needed. Pt reports 3 previous similar abscesses. Pt states that the area is painful, and notes that the area was draining when she put a new dressing on the wound this morning. Pt has been taking antibiotics as prescribed, and denies doing any warm compresses. Pt denies other associated symptoms.   Past Medical History:  Diagnosis Date  . Arthritis    knee  . Hyperlipidemia    diet controlled, no meds  . Hypertension   . SVD (spontaneous vaginal delivery)    x 4    There are no active problems to display for this patient.   Past Surgical History:  Procedure Laterality Date  . ABDOMINAL HYSTERECTOMY    . BREAST SURGERY     reduction  . btl    . KNEE SURGERY Right     OB History    No data available       Home Medications    Prior to Admission medications   Medication Sig Start Date End Date Taking? Authorizing Provider  doxycycline (VIBRAMYCIN) 100 MG capsule Take 1 capsule (100 mg total) by mouth 2 (two) times daily. 11/23/16   Glendell Docker, NP  SUPREP BOWEL PREP SOLN Take 1 kit by mouth once. Brand Name Only.  No Substitutions.  Suprep Bowel Kit. 01/17/16   Irene Shipper, MD  triamterene-hydrochlorothiazide  (DYAZIDE) 37.5-25 MG capsule Take 1 capsule by mouth every morning. 11/19/15   Historical Provider, MD    Family History Family History  Problem Relation Age of Onset  . Throat cancer Father   . Colon cancer Neg Hx   . Colon polyps Neg Hx   . Rectal cancer Neg Hx   . Stomach cancer Neg Hx     Social History Social History  Substance Use Topics  . Smoking status: Never Smoker  . Smokeless tobacco: Never Used  . Alcohol use 4.2 oz/week    7 Standard drinks or equivalent per week     Comment: beer/wine and liquor     Allergies   Patient has no known allergies.   Review of Systems Review of Systems  Constitutional: Negative for fever.  Skin: Positive for wound.     Physical Exam Updated Vital Signs BP 149/85 (BP Location: Left Arm)   Pulse 81   Temp 98 F (36.7 C) (Oral)   Resp 19   SpO2 99%   Physical Exam  Constitutional: She appears well-developed and well-nourished. No distress.  HENT:  Head: Normocephalic and atraumatic.  Eyes: Conjunctivae are normal.  Cardiovascular: Normal rate.   Pulmonary/Chest: Effort normal.  Abdominal: She exhibits no distension.  Neurological: She is alert.  Skin: Skin is warm and dry.  1 cm incision to left axilla with packing material still in place, there is purulent drainage, but no surrounding cellulitis   Psychiatric: She has a normal mood and affect.  Nursing note and vitals reviewed.    ED Treatments / Results  DIAGNOSTIC STUDIES:  Oxygen Saturation is 99% on RA, normal by my interpretation.    COORDINATION OF CARE:  9:27 AM Will remove wound packing and place a new packing. Pt instructed to drain any remaining purulence manually. Discussed treatment plan with pt at bedside and pt agreed to plan.   Labs (all labs ordered are listed, but only abnormal results are displayed) Labs Reviewed - No data to display  EKG  EKG Interpretation None       Radiology No results found.  Procedures Procedures  (including critical care time) Packing Removal: Removed with suction and hemostat Packing Insertion: 1/2" medicated packing reinserted into incision using hemostat Medications Ordered in ED Medications - No data to display   Initial Impression / Assessment and Plan / ED Course  I have reviewed the triage vital signs and the nursing notes.  Pertinent labs & imaging results that were available during my care of the patient were reviewed by me and considered in my medical decision making (see chart for details).     Patient here for wound check.  Wound is still draining.  Suctioned out a copious amount of pus and changed packing and dressing.  No surrounding cellulitis.  Patient is compliant with abx.  Recommend close follow-up with PCP.  Return to ED if symptoms worsen.  Final Clinical Impressions(s) / ED Diagnoses   Final diagnoses:  Wound check, abscess    New Prescriptions New Prescriptions   No medications on file   I personally performed the services described in this documentation, which was scribed in my presence. The recorded information has been reviewed and is accurate.        Montine Circle, PA-C 11/25/16 [place], MD 11/25/16 1014

## 2016-11-25 NOTE — ED Triage Notes (Signed)
Pt had axillary abscess drained and packed on 2/12. Pt here to have packing removed. No other complaints. Some drainage noted. Pt states taking abx.

## 2017-07-06 ENCOUNTER — Encounter (HOSPITAL_COMMUNITY): Payer: Self-pay | Admitting: *Deleted

## 2017-07-06 ENCOUNTER — Emergency Department (HOSPITAL_COMMUNITY): Payer: BLUE CROSS/BLUE SHIELD

## 2017-07-06 ENCOUNTER — Inpatient Hospital Stay (HOSPITAL_COMMUNITY)
Admission: EM | Admit: 2017-07-06 | Discharge: 2017-07-10 | DRG: 287 | Disposition: A | Discharge status: Home / Self Care | Payer: BLUE CROSS/BLUE SHIELD | Attending: Family Medicine | Admitting: Family Medicine

## 2017-07-06 DIAGNOSIS — I272 Pulmonary hypertension, unspecified: Secondary | ICD-10-CM | POA: Diagnosis present

## 2017-07-06 DIAGNOSIS — I110 Hypertensive heart disease with heart failure: Principal | ICD-10-CM | POA: Diagnosis present

## 2017-07-06 DIAGNOSIS — Z23 Encounter for immunization: Secondary | ICD-10-CM

## 2017-07-06 DIAGNOSIS — Z79899 Other long term (current) drug therapy: Secondary | ICD-10-CM

## 2017-07-06 DIAGNOSIS — F1010 Alcohol abuse, uncomplicated: Secondary | ICD-10-CM | POA: Diagnosis present

## 2017-07-06 DIAGNOSIS — E785 Hyperlipidemia, unspecified: Secondary | ICD-10-CM | POA: Diagnosis present

## 2017-07-06 DIAGNOSIS — R079 Chest pain, unspecified: Secondary | ICD-10-CM | POA: Diagnosis present

## 2017-07-06 DIAGNOSIS — R0789 Other chest pain: Secondary | ICD-10-CM | POA: Diagnosis present

## 2017-07-06 DIAGNOSIS — Z6841 Body Mass Index (BMI) 40.0 and over, adult: Secondary | ICD-10-CM

## 2017-07-06 DIAGNOSIS — Z713 Dietary counseling and surveillance: Secondary | ICD-10-CM

## 2017-07-06 DIAGNOSIS — Z90710 Acquired absence of both cervix and uterus: Secondary | ICD-10-CM

## 2017-07-06 DIAGNOSIS — I509 Heart failure, unspecified: Secondary | ICD-10-CM | POA: Diagnosis not present

## 2017-07-06 DIAGNOSIS — I5043 Acute on chronic combined systolic (congestive) and diastolic (congestive) heart failure: Secondary | ICD-10-CM | POA: Diagnosis present

## 2017-07-06 DIAGNOSIS — Z7141 Alcohol abuse counseling and surveillance of alcoholic: Secondary | ICD-10-CM

## 2017-07-06 DIAGNOSIS — I10 Essential (primary) hypertension: Secondary | ICD-10-CM | POA: Diagnosis present

## 2017-07-06 LAB — COMPREHENSIVE METABOLIC PANEL
ALT: 24 U/L (ref 14–54)
AST: 25 U/L (ref 15–41)
Albumin: 3.4 g/dL — ABNORMAL LOW (ref 3.5–5.0)
Alkaline Phosphatase: 71 U/L (ref 38–126)
Anion gap: 8 (ref 5–15)
BILIRUBIN TOTAL: 1.4 mg/dL — AB (ref 0.3–1.2)
BUN: 12 mg/dL (ref 6–20)
CALCIUM: 8.8 mg/dL — AB (ref 8.9–10.3)
CHLORIDE: 108 mmol/L (ref 101–111)
CO2: 23 mmol/L (ref 22–32)
CREATININE: 0.69 mg/dL (ref 0.44–1.00)
Glucose, Bld: 106 mg/dL — ABNORMAL HIGH (ref 65–99)
Potassium: 3.5 mmol/L (ref 3.5–5.1)
Sodium: 139 mmol/L (ref 135–145)
TOTAL PROTEIN: 7 g/dL (ref 6.5–8.1)

## 2017-07-06 LAB — CBC WITH DIFFERENTIAL/PLATELET
BASOS ABS: 0 10*3/uL (ref 0.0–0.1)
BASOS PCT: 0 %
EOS ABS: 0.1 10*3/uL (ref 0.0–0.7)
EOS PCT: 2 %
HCT: 40.6 % (ref 36.0–46.0)
Hemoglobin: 13.6 g/dL (ref 12.0–15.0)
LYMPHS PCT: 24 %
Lymphs Abs: 1.9 10*3/uL (ref 0.7–4.0)
MCH: 28.8 pg (ref 26.0–34.0)
MCHC: 33.5 g/dL (ref 30.0–36.0)
MCV: 86 fL (ref 78.0–100.0)
MONO ABS: 0.5 10*3/uL (ref 0.1–1.0)
Monocytes Relative: 7 %
Neutro Abs: 5.5 10*3/uL (ref 1.7–7.7)
Neutrophils Relative %: 67 %
Platelets: 188 10*3/uL (ref 150–400)
RBC: 4.72 MIL/uL (ref 3.87–5.11)
RDW: 13.8 % (ref 11.5–15.5)
WBC: 8 10*3/uL (ref 4.0–10.5)

## 2017-07-06 LAB — BRAIN NATRIURETIC PEPTIDE: B NATRIURETIC PEPTIDE 5: 191.4 pg/mL — AB (ref 0.0–100.0)

## 2017-07-06 NOTE — ED Triage Notes (Signed)
Pt is here with shortness of breath and states she cannot breath has been going on for a while. She has been told her heart is good and she cannot breath in the am.  She could stop and answer questions.

## 2017-07-06 NOTE — ED Provider Notes (Signed)
[place] DEPT Provider Note   CSN: [id number] Arrival date & time: 07/06/17  1252     History   Chief Complaint Chief Complaint  Patient presents with  . Shortness of Breath    HPI Meredith Shah is a 61 y.o. female.  HPI 61 year old female presents with shortness of breath. She states this has been progressive for a couple months. However it is worse over the last few days where she is having orthopnea and shortness of breath at rest. Typically it is only with movement or exertion. A couple days ago she had brief chest pains at work that she described as sharp and resolved with rest. She has noticed some ankle swelling but not significantly different than typical. Some abdominal swelling. She denies any prior cardiac history but does have a history of hypertension and hyperlipidemia. No significant cough but she feels like there's something in her lung seems to cough up.  Past Medical History:  Diagnosis Date  . Arthritis    knee  . Hyperlipidemia    diet controlled, no meds  . Hypertension   . SVD (spontaneous vaginal delivery)    x 4    Patient Active Problem List   Diagnosis Date Noted  . Acute CHF (congestive heart failure) ([place]) 07/07/2017  . Chest pain 07/07/2017  . Essential hypertension 07/07/2017  . CHF (congestive heart failure) ([place]) 07/07/2017    Past Surgical History:  Procedure Laterality Date  . ABDOMINAL HYSTERECTOMY    . BREAST SURGERY     reduction  . btl    . KNEE SURGERY Right     OB History    No data available       Home Medications    Prior to Admission medications   Medication Sig Start Date End Date Taking? Authorizing Provider  Calcium Carbonate Antacid (ALKA-SELTZER ANTACID PO) Take 1 tablet by mouth as needed.   Yes [provider]  ibuprofen (ADVIL,MOTRIN) 100 MG/5ML suspension Take 200 mg by mouth every 4 (four) hours as needed.   Yes [provider]  doxycycline (VIBRAMYCIN) 100 MG capsule Take 1  capsule (100 mg total) by mouth 2 (two) times daily. Patient not taking: Reported on 07/06/2017 11/23/16   Glendell Docker, NP  SUPREP BOWEL PREP SOLN Take 1 kit by mouth once. Brand Name Only.  No Substitutions.  Suprep Bowel Kit. Patient not taking: Reported on 07/06/2017 01/17/16   Irene Shipper, MD    Family History Family History  Problem Relation Age of Onset  . Throat cancer Father   . Colon cancer Neg Hx   . Colon polyps Neg Hx   . Rectal cancer Neg Hx   . Stomach cancer Neg Hx     Social History Social History  Substance Use Topics  . Smoking status: Never Smoker  . Smokeless tobacco: Never Used  . Alcohol use 4.2 oz/week    7 Standard drinks or equivalent per week     Comment: beer/wine and liquor     Allergies   Patient has no known allergies.   Review of Systems Review of Systems  Constitutional: Negative for fever.  Respiratory: Positive for shortness of breath. Negative for cough.   Cardiovascular: Positive for chest pain and leg swelling (ankle swelling).  Gastrointestinal: Positive for abdominal distention. Negative for abdominal pain.  All other systems reviewed and are negative.    Physical Exam Updated Vital Signs BP 125/76   Pulse 73   Temp (!) 97.5 F (36.4 C) (  Oral)   Resp 16   SpO2 98%   Physical Exam  Constitutional: She is oriented to person, place, and time. She appears well-developed and well-nourished. No distress.  HENT:  Head: Normocephalic and atraumatic.  Right Ear: External ear normal.  Left Ear: External ear normal.  Nose: Nose normal.  Eyes: Right eye exhibits no discharge. Left eye exhibits no discharge.  Cardiovascular: Normal rate, regular rhythm and normal heart sounds.   Pulmonary/Chest: Effort normal.  Mildly decreased BS at bases  Abdominal: Soft. She exhibits no distension. There is no tenderness.  Musculoskeletal: She exhibits edema (mild nonpitting edema to bilateral ankles).  Neurological: She is alert and  oriented to person, place, and time.  Skin: Skin is warm and dry. She is not diaphoretic.  Nursing note and vitals reviewed.    ED Treatments / Results  Labs (all labs ordered are listed, but only abnormal results are displayed) Labs Reviewed  COMPREHENSIVE METABOLIC PANEL - Abnormal; Notable for the following:       Result Value   Glucose, Bld 106 (*)    Calcium 8.8 (*)    Albumin 3.4 (*)    Total Bilirubin 1.4 (*)    All other components within normal limits  BRAIN NATRIURETIC PEPTIDE - Abnormal; Notable for the following:    B Natriuretic Peptide 191.4 (*)    All other components within normal limits  TROPONIN I - Abnormal; Notable for the following:    Troponin I 0.05 (*)    All other components within normal limits  CBC WITH DIFFERENTIAL/PLATELET  TROPONIN I  TROPONIN I  TROPONIN I  HIV ANTIBODY (ROUTINE TESTING)  CBC  CBC  CREATININE, SERUM  TSH  MAGNESIUM    EKG  EKG Interpretation  Date/Time:  Tuesday July 06 2017 13:00:55 EDT Ventricular Rate:  86 PR Interval:  156 QRS Duration: 110 QT Interval:  412 QTC Calculation: 493 R Axis:   19 Text Interpretation:  Normal sinus rhythm Cannot rule out Anterior infarct , age undetermined Abnormal ECG T wave changes similar to Mar 2017 Confirmed by Sherwood Gambler [phone]) on 07/06/2017 7:55:09 PM       Radiology Dg Chest 2 View  Result Date: 07/06/2017 CLINICAL DATA:  Shortness of breath for few days. EXAM: CHEST  2 VIEW COMPARISON:  March 1st 2017 FINDINGS: The heart size is mildly enlarged. Mediastinal contour is normal. There is mild bilateral diffuse increased pulmonary interstitium bilaterally. There is no focal pneumonia or or pleural effusion. Degenerative joint changes of the spine are noted. IMPRESSION: Mild congestive heart failure. Electronically Signed   By: Abelardo Diesel M.D.   On: 07/06/2017 13:31    Procedures Procedures (including critical care time)  Medications Ordered in ED Medications    acetaminophen (TYLENOL) tablet 650 mg (not administered)    Or  acetaminophen (TYLENOL) suppository 650 mg (not administered)  ondansetron (ZOFRAN) tablet 4 mg (not administered)    Or  ondansetron (ZOFRAN) injection 4 mg (not administered)  furosemide (LASIX) injection 40 mg (not administered)  potassium chloride (K-DUR,KLOR-CON) CR tablet 10 mEq (not administered)  lisinopril (PRINIVIL,ZESTRIL) tablet 5 mg (not administered)  aspirin EC tablet 81 mg (not administered)  enoxaparin (LOVENOX) injection 40 mg (not administered)  aspirin chewable tablet 324 mg (324 mg Oral Given 07/07/17 0014)  furosemide (LASIX) injection 40 mg (40 mg Intravenous Given 07/07/17 0014)     Initial Impression / Assessment and Plan / ED Course  I have reviewed the triage vital signs and the  nursing notes.  Pertinent labs & imaging results that were available during my care of the patient were reviewed by me and considered in my medical decision making (see chart for details).     Patient likely has new onset mild CHF. Given no current PCP, with poor follow up and being quite symptomatic, needs admission for expedited workup and treatment. Dr. Hal Hope to admit. Stable. Mild trop elevation likely from CHF rather than ACS, no CP in over 3 days.   Final Clinical Impressions(s) / ED Diagnoses   Final diagnoses:  Acute congestive heart failure, unspecified heart failure type [facility])    New Prescriptions New Prescriptions   No medications on file     Sherwood Gambler, MD 07/07/17 0117

## 2017-07-07 ENCOUNTER — Encounter (HOSPITAL_COMMUNITY): Payer: Self-pay | Admitting: Internal Medicine

## 2017-07-07 ENCOUNTER — Inpatient Hospital Stay (HOSPITAL_COMMUNITY): Payer: BLUE CROSS/BLUE SHIELD

## 2017-07-07 DIAGNOSIS — R079 Chest pain, unspecified: Secondary | ICD-10-CM | POA: Diagnosis not present

## 2017-07-07 DIAGNOSIS — I509 Heart failure, unspecified: Secondary | ICD-10-CM | POA: Diagnosis present

## 2017-07-07 DIAGNOSIS — Z23 Encounter for immunization: Secondary | ICD-10-CM | POA: Diagnosis not present

## 2017-07-07 DIAGNOSIS — I5021 Acute systolic (congestive) heart failure: Secondary | ICD-10-CM | POA: Diagnosis not present

## 2017-07-07 DIAGNOSIS — I10 Essential (primary) hypertension: Secondary | ICD-10-CM | POA: Diagnosis present

## 2017-07-07 DIAGNOSIS — I110 Hypertensive heart disease with heart failure: Secondary | ICD-10-CM | POA: Diagnosis present

## 2017-07-07 DIAGNOSIS — I5023 Acute on chronic systolic (congestive) heart failure: Secondary | ICD-10-CM | POA: Diagnosis not present

## 2017-07-07 DIAGNOSIS — Z713 Dietary counseling and surveillance: Secondary | ICD-10-CM | POA: Diagnosis not present

## 2017-07-07 DIAGNOSIS — I5043 Acute on chronic combined systolic (congestive) and diastolic (congestive) heart failure: Secondary | ICD-10-CM | POA: Diagnosis present

## 2017-07-07 DIAGNOSIS — Z7141 Alcohol abuse counseling and surveillance of alcoholic: Secondary | ICD-10-CM | POA: Diagnosis not present

## 2017-07-07 DIAGNOSIS — F1010 Alcohol abuse, uncomplicated: Secondary | ICD-10-CM | POA: Diagnosis present

## 2017-07-07 DIAGNOSIS — I361 Nonrheumatic tricuspid (valve) insufficiency: Secondary | ICD-10-CM

## 2017-07-07 DIAGNOSIS — Z6841 Body Mass Index (BMI) 40.0 and over, adult: Secondary | ICD-10-CM | POA: Diagnosis not present

## 2017-07-07 DIAGNOSIS — I272 Pulmonary hypertension, unspecified: Secondary | ICD-10-CM | POA: Diagnosis present

## 2017-07-07 DIAGNOSIS — Z90710 Acquired absence of both cervix and uterus: Secondary | ICD-10-CM | POA: Diagnosis not present

## 2017-07-07 DIAGNOSIS — R0683 Snoring: Secondary | ICD-10-CM

## 2017-07-07 DIAGNOSIS — E785 Hyperlipidemia, unspecified: Secondary | ICD-10-CM | POA: Diagnosis not present

## 2017-07-07 DIAGNOSIS — Z79899 Other long term (current) drug therapy: Secondary | ICD-10-CM | POA: Diagnosis not present

## 2017-07-07 DIAGNOSIS — R0789 Other chest pain: Secondary | ICD-10-CM | POA: Diagnosis present

## 2017-07-07 HISTORY — DX: Morbid (severe) obesity due to excess calories: E66.01

## 2017-07-07 LAB — HIV ANTIBODY (ROUTINE TESTING W REFLEX): HIV SCREEN 4TH GENERATION: NONREACTIVE

## 2017-07-07 LAB — CBC
HCT: 40.9 % (ref 36.0–46.0)
HEMATOCRIT: 40.8 % (ref 36.0–46.0)
HEMOGLOBIN: 13.7 g/dL (ref 12.0–15.0)
HEMOGLOBIN: 13.4 g/dL (ref 12.0–15.0)
MCH: 29 pg (ref 26.0–34.0)
MCH: 28.8 pg (ref 26.0–34.0)
MCHC: 33.5 g/dL (ref 30.0–36.0)
MCHC: 32.8 g/dL (ref 30.0–36.0)
MCV: 86.5 fL (ref 78.0–100.0)
MCV: 87.6 fL (ref 78.0–100.0)
PLATELETS: 192 10*3/uL (ref 150–400)
Platelets: 190 10*3/uL (ref 150–400)
RBC: 4.73 MIL/uL (ref 3.87–5.11)
RBC: 4.66 MIL/uL (ref 3.87–5.11)
RDW: 13.5 % (ref 11.5–15.5)
RDW: 13.9 % (ref 11.5–15.5)
WBC: 9 10*3/uL (ref 4.0–10.5)
WBC: 8.3 10*3/uL (ref 4.0–10.5)

## 2017-07-07 LAB — CREATININE, SERUM
CREATININE: 0.98 mg/dL (ref 0.44–1.00)
GFR calc Af Amer: >60 mL/min (ref >60)

## 2017-07-07 LAB — MAGNESIUM: MAGNESIUM: 1.7 mg/dL (ref 1.7–2.4)

## 2017-07-07 LAB — D-DIMER, QUANTITATIVE: D-Dimer, Quant: 0.4 ug/mL-FEU (ref 0.00–0.50)

## 2017-07-07 LAB — ECHOCARDIOGRAM COMPLETE
HEIGHTINCHES: 65.5 in
WEIGHTICAEL: 4480 [oz_av]

## 2017-07-07 LAB — TROPONIN I
TROPONIN I: 0.05 ng/mL — AB (ref <0.03)
Troponin I: 0.03 ng/mL (ref <0.03)
Troponin I: <0.03 ng/mL (ref <0.03)

## 2017-07-07 LAB — TSH: TSH: 1.963 u[IU]/mL (ref 0.350–4.500)

## 2017-07-07 MED ORDER — FOLIC ACID 1 MG PO TABS
1.0000 mg | ORAL_TABLET | Freq: Every day | ORAL | Status: DC
  Administered 2017-07-07 – 2017-07-10 (×4): 1 mg via ORAL
  Filled 2017-07-07 (×5): qty 1

## 2017-07-07 MED ORDER — LORAZEPAM 1 MG PO TABS: 0.0000 mg | ORAL_TABLET | Freq: Two times a day (BID) | ORAL | Status: DC

## 2017-07-07 MED ORDER — ASPIRIN EC 81 MG PO TBEC
81.0000 mg | DELAYED_RELEASE_TABLET | Freq: Every day | ORAL | Status: DC
  Administered 2017-07-07 – 2017-07-10 (×3): 81 mg via ORAL
  Filled 2017-07-07 (×4): qty 1

## 2017-07-07 MED ORDER — LORAZEPAM 2 MG/ML IJ SOLN
1.0000 mg | Freq: Four times a day (QID) | INTRAMUSCULAR | Status: AC | PRN
  Administered 2017-07-08: 1 mg via INTRAVENOUS
  Filled 2017-07-07: qty 1

## 2017-07-07 MED ORDER — PNEUMOCOCCAL VAC POLYVALENT 25 MCG/0.5ML IJ INJ
0.5000 mL | INJECTION | INTRAMUSCULAR | Status: DC
  Filled 2017-07-07: qty 0.5

## 2017-07-07 MED ORDER — FUROSEMIDE 10 MG/ML IJ SOLN
40.0000 mg | Freq: Two times a day (BID) | INTRAMUSCULAR | Status: DC
  Administered 2017-07-07 – 2017-07-10 (×7): 40 mg via INTRAVENOUS
  Filled 2017-07-07 (×6): qty 4

## 2017-07-07 MED ORDER — FUROSEMIDE 10 MG/ML IJ SOLN
40.0000 mg | Freq: Once | INTRAMUSCULAR | Status: AC
  Administered 2017-07-07: 40 mg via INTRAVENOUS
  Filled 2017-07-07: qty 4

## 2017-07-07 MED ORDER — ENOXAPARIN SODIUM 40 MG/0.4ML ~~LOC~~ SOLN
40.0000 mg | SUBCUTANEOUS | Status: DC
  Administered 2017-07-07 – 2017-07-08 (×2): 40 mg via SUBCUTANEOUS
  Filled 2017-07-07 (×3): qty 0.4

## 2017-07-07 MED ORDER — LORAZEPAM 1 MG PO TABS: 1.0000 mg | ORAL_TABLET | Freq: Four times a day (QID) | ORAL | Status: AC | PRN

## 2017-07-07 MED ORDER — INFLUENZA VAC SPLIT QUAD 0.5 ML IM SUSY
0.5000 mL | PREFILLED_SYRINGE | INTRAMUSCULAR | Status: DC
  Filled 2017-07-07: qty 0.5

## 2017-07-07 MED ORDER — VITAMIN B-1 100 MG PO TABS
100.0000 mg | ORAL_TABLET | Freq: Every day | ORAL | Status: DC
  Administered 2017-07-07 – 2017-07-10 (×4): 100 mg via ORAL
  Filled 2017-07-07 (×4): qty 1

## 2017-07-07 MED ORDER — LORAZEPAM 1 MG PO TABS: 0.0000 mg | ORAL_TABLET | Freq: Four times a day (QID) | ORAL | Status: AC

## 2017-07-07 MED ORDER — ASPIRIN 81 MG PO CHEW
324.0000 mg | CHEWABLE_TABLET | Freq: Once | ORAL | Status: AC
  Administered 2017-07-07: 324 mg via ORAL
  Filled 2017-07-07: qty 4

## 2017-07-07 MED ORDER — POTASSIUM CHLORIDE CRYS ER 10 MEQ PO TBCR
10.0000 meq | EXTENDED_RELEASE_TABLET | Freq: Every day | ORAL | Status: DC
  Administered 2017-07-07 – 2017-07-08 (×3): 10 meq via ORAL
  Filled 2017-07-07 (×3): qty 1

## 2017-07-07 MED ORDER — ONDANSETRON HCL 4 MG/2ML IJ SOLN: 4.0000 mg | Freq: Four times a day (QID) | INTRAMUSCULAR | Status: DC | PRN

## 2017-07-07 MED ORDER — THIAMINE HCL 100 MG/ML IJ SOLN
100.0000 mg | Freq: Every day | INTRAMUSCULAR | Status: DC
  Filled 2017-07-07: qty 2

## 2017-07-07 MED ORDER — ADULT MULTIVITAMIN W/MINERALS CH
1.0000 | ORAL_TABLET | Freq: Every day | ORAL | Status: DC
  Administered 2017-07-07 – 2017-07-10 (×4): 1 via ORAL
  Filled 2017-07-07 (×4): qty 1

## 2017-07-07 MED ORDER — ONDANSETRON HCL 4 MG PO TABS: 4.0000 mg | ORAL_TABLET | Freq: Four times a day (QID) | ORAL | Status: DC | PRN

## 2017-07-07 MED ORDER — LISINOPRIL 5 MG PO TABS
5.0000 mg | ORAL_TABLET | Freq: Every day | ORAL | Status: DC
  Administered 2017-07-07 – 2017-07-10 (×4): 5 mg via ORAL
  Filled 2017-07-07 (×4): qty 1

## 2017-07-07 MED ORDER — ACETAMINOPHEN 650 MG RE SUPP: 650.0000 mg | Freq: Four times a day (QID) | RECTAL | Status: DC | PRN

## 2017-07-07 MED ORDER — ACETAMINOPHEN 325 MG PO TABS
650.0000 mg | ORAL_TABLET | Freq: Four times a day (QID) | ORAL | Status: DC | PRN
  Administered 2017-07-08 (×2): 650 mg via ORAL
  Filled 2017-07-07 (×2): qty 2

## 2017-07-07 NOTE — Progress Notes (Signed)
Patient seen and evaluated earlier this a.m. by my associate. Please refer to H&P for history, assessment, and plan. Cardiology consulted.  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 We'll continue diuresis.  Will reassess next am  Gen: pt in nad CV: s1 and s2  Present Pulm: no increased wob, no wheezes  Marcell Chavarin, Pamala Hurry

## 2017-07-07 NOTE — Consult Note (Addendum)
The patient has been seen in conjunction with Meredith Drone, PA-C. All aspects of care have been considered and discussed. The patient has been personally interviewed, examined, and all clinical data has been reviewed.   Presentation is consistent with acute on chronic heart failure. 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 Agree with diuresis. Echocardiogram to help confirm. Will also exclude pulmonary hypertension.  Snores loudly and in the setting of morbid obesity, OSA will need to be excluded.  LV status will determine if inpatient or outpatient ischemic evaluation is necessary.  We will follow.   Cardiology Consult    Patient ID: Meredith Shah; [id number]; Jun 21, 1956   Admit date: 07/06/2017 Date of Consult: 07/07/2017  Primary Care Provider: Patient, No Pcp Per Primary Cardiologist: Meredith Shah - Meredith Shah   Patient Profile    Meredith Shah is a 61 y.o. female with past medical history of HTN and HLD (diet-controlled) who is being seen today for the evaluation of dyspnea and chest pain at the request of Meredith Shah.   History of Present Illness    [name] reports a history of dyspnea on exertion for the past year. She was evaluated by [facility] and says she had an EKG and echocardiogram performed in 2017 which were normal (not available for review in Care Everywhere).   Over the past weeks, her dyspnea has acutely worsened. This occurs with minimal activity such as walking down the hallway at work but also occurs at rest. Reports significant orthopnea and PND for the past two weeks which is relieved with walking around her home. Does report one episode of chest pain last Friday which occurred while walking down the hallway at work, at which time she notes she could not catch her breath. Upon sitting down, her breathing improved and the pain resolved.   She has noticed mild lower extremity edema and abdominal distension.  Reports her weight is usually in the 270's, up to 280 lbs on admission. She was previously on Triamterene-HCTZ for HTN but has not taken this within the past few months. BP has been variable at 109/60 - 157/107 since admission.   She denies any prior cardiac history. Does have HLD but reports this is diet-controlled. No prior tobacco use. Consumes 1-2 beers per day. No known family history of CAD.   Initial labs show WBC of 8.0, Hgb 13.6, platelets 188, K+ 3.5, creatinine 0.69, and BNP 191 (normal at 67 in 10/2015). Initial troponin at 0.05 with repeats of 0.03 and < 0.03. D-dimer negative. CXR consistent with mild CHF. EKG shows NSR, HR 86, and no acute ST or T-wave changes when compared to prior tracings.   She has been started on IV Lasix '40mg'$  BID. Output has not been recorded but she reports significant diuresis since admission and notes improvement in her respiratory status.    Past Medical History   Past Medical History:  Diagnosis Date  . Arthritis    knee  . Hyperlipidemia    diet controlled, no meds  . Hypertension   . SVD (spontaneous vaginal delivery)    x 4     Allergies:   No Known Allergies  Home Medications:   Home Medications:  Prior to Admission medications   Medication Sig Start Date End Date Taking? Authorizing Provider  Calcium Carbonate Antacid (ALKA-SELTZER ANTACID PO) Take 1 tablet by mouth as needed.   Yes [provider]  ibuprofen (ADVIL,MOTRIN) 100 MG/5ML suspension Take 200 mg by  mouth every 4 (four) hours as needed.   Yes [provider]  doxycycline (VIBRAMYCIN) 100 MG capsule Take 1 capsule (100 mg total) by mouth 2 (two) times daily. Patient not taking: Reported on 07/06/2017 11/23/16   Glendell Docker, NP  SUPREP BOWEL PREP SOLN Take 1 kit by mouth once. Brand Name Only.  No Substitutions.  Suprep Bowel Kit. Patient not taking: Reported on 07/06/2017 01/17/16   Irene Shipper, MD    Inpatient Medications    Scheduled Meds: .  aspirin EC  81 mg Oral Daily  . enoxaparin (LOVENOX) injection  40 mg Subcutaneous Q24H  . folic acid  1 mg Oral Daily  . furosemide  40 mg Intravenous Q12H  . [START ON 07/08/2017] Influenza vac split quadrivalent PF  0.5 mL Intramuscular Tomorrow-1000  . lisinopril  5 mg Oral Daily  . LORazepam  0-4 mg Oral Q6H   Followed by  . [START ON 07/09/2017] LORazepam  0-4 mg Oral Q12H  . multivitamin with minerals  1 tablet Oral Daily  . [START ON 07/08/2017] pneumococcal 23 valent vaccine  0.5 mL Intramuscular Tomorrow-1000  . potassium chloride  10 mEq Oral Daily  . thiamine  100 mg Oral Daily   Or  . thiamine  100 mg Intravenous Daily   Continuous Infusions:  PRN Meds: acetaminophen **OR** acetaminophen, LORazepam **OR** LORazepam, ondansetron **OR** ondansetron (ZOFRAN) IV  Family History    Family History  Problem Relation Age of Onset  . Throat cancer Father   . Colon cancer Neg Hx   . Colon polyps Neg Hx   . Rectal cancer Neg Hx   . Stomach cancer Neg Hx     Social History    Social History   Social History  . Marital status: Significant Other    Spouse name: N/A  . Number of children: N/A  . Years of education: N/A   Occupational History  . Not on file.   Social History Main Topics  . Smoking status: Never Smoker  . Smokeless tobacco: Never Used  . Alcohol use 4.2 oz/week    7 Standard drinks or equivalent per week     Comment: beer/wine and liquor  . Drug use: No  . Sexual activity: Yes    Birth control/ protection: Post-menopausal   Other Topics Concern  . Not on file   Social History Narrative  . No narrative on file     Review of Systems    General:  No chills, fever, night sweats or weight changes.  Cardiovascular:  No palpitations. Positive for chest pain, dyspnea on exertion, edema, orthopnea, and PND.  Dermatological: No rash, lesions/masses Respiratory: No cough, dyspnea Urologic: No hematuria, dysuria Abdominal:   No nausea, vomiting,  diarrhea, bright red blood per rectum, melena, or hematemesis Neurologic:  No visual changes, wkns, changes in mental status. All other systems reviewed and are otherwise negative except as noted above.  Physical Exam/Data    Blood pressure 109/60, pulse 86, temperature 98.6 F (37 C), temperature source Oral, resp. rate 20, height 5' 5.5" (1.664 m), weight 280 lb (127 kg), SpO2 95 %.  General: Pleasant, obese African American female appearing in NAD Psych: Normal affect. Neuro: Alert and oriented X 3. Moves all extremities spontaneously. HEENT: Normal  Neck: Supple without bruits. JVD difficult to assess secondary to body habitus. Lungs:  Resp regular and unlabored, CTA. Heart: RRR no s3, s4, or murmurs. Abdomen: Soft, non-tender, BS + x 4. Appears distended.  Extremities: No  clubbing, cyanosis or edema. DP/PT/Radials 2+ and equal bilaterally.   EKG:  The EKG was personally reviewed and demonstrates:NSR, HR 86, and no acute ST or T-wave changes when compared to prior tracings.   Telemetry:  Telemetry was personally reviewed and demonstrates: NSR with occasional PVC's.   Labs/Studies     Relevant CV Studies:  Echocardiogram: Pending  Laboratory Data:  Chemistry  Recent Labs Lab 07/06/17 2232 07/07/17 0244  NA 139  --   K 3.5  --   CL 108  --   CO2 23  --   GLUCOSE 106*  --   BUN 12  --   CREATININE 0.69 0.98  CALCIUM 8.8*  --   GFRNONAA >60 >60  GFRAA >60 >60  ANIONGAP 8  --      Recent Labs Lab 07/06/17 2232  PROT 7.0  ALBUMIN 3.4*  AST 25  ALT 24  ALKPHOS 71  BILITOT 1.4*   Hematology  Recent Labs Lab 07/06/17 2232 07/07/17 0244 07/07/17 0633  WBC 8.0 9.0 8.3  RBC 4.72 4.73 4.66  HGB 13.6 13.7 13.4  HCT 40.6 40.9 40.8  MCV 86.0 86.5 87.6  MCH 28.8 29.0 28.8  MCHC 33.5 33.5 32.8  RDW 13.8 13.5 13.9  PLT 188 192 190   Cardiac Enzymes  Recent Labs Lab 07/06/17 2232 07/07/17 0244 07/07/17 0633  TROPONINI 0.05* 0.03* <0.03   No  results for input(s): TROPIPOC in the last 168 hours.  BNP  Recent Labs Lab 07/06/17 2232  BNP 191.4*    DDimer   Recent Labs Lab 07/07/17 0244  DDIMER 0.40    Radiology/Studies:  Dg Chest 2 View  Result Date: 07/06/2017 CLINICAL DATA:  Shortness of breath for few days. EXAM: CHEST  2 VIEW COMPARISON:  March 1st 2017 FINDINGS: The heart size is mildly enlarged. Mediastinal contour is normal. There is mild bilateral diffuse increased pulmonary interstitium bilaterally. There is no focal pneumonia or or pleural effusion. Degenerative joint changes of the spine are noted. IMPRESSION: Mild congestive heart failure. Electronically Signed   By: Abelardo Diesel M.D.   On: 07/06/2017 13:31     Assessment & Plan    1. Acute CHF Exacerbation - the patient presents with a 1+ year history of worsening dyspnea but has noted acute orthopnea, PND, and abdominal distension over the past 2 weeks. Weight up 10 lbs from baseline.  - BNP slightly elevated to 191 (normal at 67 in 10/2015). CXR consistent with mild CHF exacerbation.  - echocardiogram is pending to assess systolic vs. diastolic dysfunction. Likely secondary to untreated HTN.  - she has been started on IV Lasix '40mg'$  BID. Output has not been recorded but she reports significant diuresis since admission. Continue with IV Lasix at current dosing and reassess volume status and symptoms in the morning.    2. Chest Pain - reports one episode of pain last Friday which occurred with acute dyspnea. No recurrent exertional chest pain.  - EKG is unchanged from prior tracings and cyclic troponin values have been flat at 0.05, 0.03, and < 0.03. - known HLD and HTN but she denies any known Type 2 DM, prior tobacco use, or family history of CAD. - echocardiogram is pending to assess LV function. If EF is preserved, consider outpatient NST. If reduced, she will need further inpatient ischemic evaluation.   3. HTN - BP has been variable at 109/60 -  157/107 since admission.  - previously on Triamterene-HCTZ but self-discontinued this. Continue to follow  BP.  4. HLD - Lipid Panel in 10/2015 showed total cholesterol of 234, HDL 53, and LDL 154.  5. Morbid Obesity - BMI at 46.6.  - she would benefit from a sleep study as an outpatient.   Signed, Erma Heritage, PA-C 07/07/2017, 11:57 AM Pager: [phone]

## 2017-07-07 NOTE — Progress Notes (Signed)
  Echocardiogram 2D Echocardiogram has been performed.  Meredith Shah 07/07/2017, 2:30 PM

## 2017-07-07 NOTE — Progress Notes (Signed)
Heart Failure Navigator Consult Note  Presentation: per Dr. Hassie Bruce Meredith Shah is a 61 y.o. female with history of hypertension who has not been following up with her PCP and has not been taking her medications presents to the ER with complaints of shortness of breath and chest pain. Patient states over the last few weeks patient has been having progressive shortness of breath on exertion and on lying down. Denies any fever chills productive cough. Patient has been having some chest pressure which increases on walking and decreases on rest.  Presently chest pain-free.  Past Medical History:  Diagnosis Date  . Arthritis    knee  . Hyperlipidemia    diet controlled, no meds  . Hypertension   . SVD (spontaneous vaginal delivery)    x 4    Social History   Social History  . Marital status: Significant Other    Spouse name: N/A  . Number of children: N/A  . Years of education: N/A   Social History Main Topics  . Smoking status: Never Smoker  . Smokeless tobacco: Never Used  . Alcohol use 4.2 oz/week    7 Standard drinks or equivalent per week     Comment: beer/wine and liquor  . Drug use: No  . Sexual activity: Yes    Birth control/ protection: Post-menopausal   Other Topics Concern  . None   Social History Narrative  . None    ECHO: pending  BNP    Component Value Date/Time   BNP 191.4 (H) 07/06/2017 2232    ProBNP No results found for: PROBNP   Education Assessment and Provision:  Detailed education and instructions provided on heart failure disease management including the following:  Signs and symptoms of Heart Failure When to call the physician Importance of daily weights Low sodium diet Fluid restriction Medication management Anticipated future follow-up appointments  Patient education given on each of the above topics.  Patient acknowledges understanding and acceptance of all instructions.  I spoke with Meredith Shah and her current  hospitalization and new HF diagnosis.  I reviewed the importance of daily weights and when to contact the physician.  She does not currently have a scale however says that she can afford one after discharge.  We discussed a low sodium diet and high sodium foods to avoid.  I also reviewed the importance of taking prescribed medications-she acknowledges that taking medications will be "new" for her.  She will follow with [facility].  Education Materials:  "Living Better With Heart Failure" Booklet, Daily Weight Tracker Tool   High Risk Criteria for Readmission and/or Poor Patient Outcomes:   EF <30%- echo pending  2 or more admissions in 6 months-New HF-No  Difficult social situation- No  Demonstrates medication noncompliance-No   Barriers of Care:  Knowledge and compliance  Discharge Planning:   Plans to return to home with son and friend in [place].  She will need ongoing education and compliance reinforcement.

## 2017-07-07 NOTE — Evaluation (Signed)
Physical Therapy Evaluation Patient Details Name: Meredith Shah MRN: [id number] DOB: [birth year] Today's Date: 07/07/2017   History of Present Illness  Pt is a 61 y/o female admitted secondary to SOB, found to have an acute CHF exacerbation. PMH including but not limited to HTN, HLD.  Clinical Impression  Pt presented supine in bed with HOB elevated, awake and willing to participate in therapy session. Prior to admission, pt reported that she was independent with all functional mobility and ADLs. Pt ambulated within her room without an AD or physical assistance. Pt on RA throughout with SPO2 maintaining >91%. Pt reported that she feels she is back to her functional baseline with no difficulty breathing. No further acute PT needs identified at this time. PT signing off.    Follow Up Recommendations No PT follow up    Equipment Recommendations  None recommended by PT    Recommendations for Other Services       Precautions / Restrictions Precautions Precautions: None Restrictions Weight Bearing Restrictions: No      Mobility  Bed Mobility Overal bed mobility: Modified Independent                Transfers Overall transfer level: Modified independent Equipment used: None                Ambulation/Gait Ambulation/Gait assistance: Supervision Ambulation Distance (Feet): 40 Feet Assistive device: None Gait Pattern/deviations: Step-through pattern;Decreased stride length Gait velocity: decreased Gait velocity interpretation: Below normal speed for age/gender General Gait Details: pt with mild instability but no overt LOB or need for physical assistance, slow, cautious gait. pt ambulated on RA with SPO2 maintaining >91% throughout  Stairs            Wheelchair Mobility    Modified Rankin (Stroke Patients Only)       Balance Overall balance assessment: Needs assistance Sitting-balance support: Feet supported Sitting balance-Leahy Scale: Good      Standing balance support: During functional activity;No upper extremity supported Standing balance-Leahy Scale: Good                               Pertinent Vitals/Pain Pain Assessment: No/denies pain    Home Living Family/patient expects to be discharged to:: Private residence Living Arrangements: Spouse/significant other Available Help at Discharge: Friend(s);Family;Available PRN/intermittently Type of Home: House Home Access: Stairs to enter Entrance Stairs-Rails: None Entrance Stairs-Number of Steps: 3 Home Layout: One level Home Equipment: Cane - single point;Grab bars - tub/shower      Prior Function Level of Independence: Independent         Comments: works full-time as a Lawyer at [employer]   Dominant Hand: Left    Extremity/Trunk Assessment   Upper Extremity Assessment Upper Extremity Assessment: Overall WFL for tasks assessed    Lower Extremity Assessment Lower Extremity Assessment: Overall WFL for tasks assessed    Cervical / Trunk Assessment Cervical / Trunk Assessment: Normal  Communication   Communication: No difficulties  Cognition Arousal/Alertness: Awake/alert Behavior During Therapy: WFL for tasks assessed/performed Overall Cognitive Status: Within Functional Limits for tasks assessed                                        General Comments      Exercises     Assessment/Plan    PT Assessment  Patent does not need any further PT services  PT Problem List         PT Treatment Interventions      PT Goals (Current goals can be found in the Care Plan section)  Acute Rehab PT Goals Patient Stated Goal: return home    Frequency     Barriers to discharge        Co-evaluation               AM-PAC PT "6 Clicks" Daily Activity  Outcome Measure Difficulty turning over in bed (including adjusting bedclothes, sheets and blankets)?: None Difficulty moving from lying on back to  sitting on the side of the bed? : None Difficulty sitting down on and standing up from a chair with arms (e.g., wheelchair, bedside commode, etc,.)?: None Help needed moving to and from a bed to chair (including a wheelchair)?: None Help needed walking in hospital room?: None Help needed climbing 3-5 steps with a railing? : A Little 6 Click Score: 23    End of Session   Activity Tolerance: Patient tolerated treatment well Patient left: in bed;with call bell/phone within reach Nurse Communication: Mobility status PT Visit Diagnosis: Other abnormalities of gait and mobility (R26.89)    Time: 7618-4859 PT Time Calculation (min) (ACUTE ONLY): 13 min   Charges:   PT Evaluation $PT Eval Moderate Complexity: 1 Mod     PT G Codes:        [place], PT, DPT [phone]   Alessandra Bevels Audie Wieser 07/07/2017, 5:39 PM

## 2017-07-07 NOTE — H&P (Signed)
History and Physical    COLETA GROSSHANS WEX:[id number] DOB: 15-Dec-1955 DOA: 07/06/2017  PCP: Patient, No Pcp Per  Patient coming from: home.  Chief Complaint: shortness of breath.  HPI: Meredith Shah is a 61 y.o. female with history of hypertension who has not been following up with her PCP and has not been taking her medications presents to the ER with complaints of shortness of breath and chest pain. Patient states over the last few weeks patient has been having progressive shortness of breath on exertion and on lying down. Denies any fever chills productive cough. Patient has been having some chest pressure which increases on walking and decreases on rest.Presently chest pain-free.  ED Course: in the ER patient's chest x-ray shows features concerning for CHF. BNP was 191 with troponin mildly elevated at 0.05 and EKG showing nonspecific T-wave changes. Patient was given Lasix 40 mg IV and admitted for further management of CHF and chest pain. On exam patient has elevated JVD and lower extremity edema.  Review of Systems: As per HPI, rest all negative.   Past Medical History:  Diagnosis Date  . Arthritis    knee  . Hyperlipidemia    diet controlled, no meds  . Hypertension   . SVD (spontaneous vaginal delivery)    x 4    Past Surgical History:  Procedure Laterality Date  . ABDOMINAL HYSTERECTOMY    . BREAST SURGERY     reduction  . btl    . KNEE SURGERY Right      reports that she has never smoked. She has never used smokeless tobacco. She reports that she drinks about 4.2 oz of alcohol per week . She reports that she does not use drugs.  No Known Allergies  Family History  Problem Relation Age of Onset  . Throat cancer Father   . Colon cancer Neg Hx   . Colon polyps Neg Hx   . Rectal cancer Neg Hx   . Stomach cancer Neg Hx     Prior to Admission medications   Medication Sig Start Date End Date Taking? Authorizing Provider  Calcium Carbonate Antacid  (ALKA-SELTZER ANTACID PO) Take 1 tablet by mouth as needed.   Yes [provider]  ibuprofen (ADVIL,MOTRIN) 100 MG/5ML suspension Take 200 mg by mouth every 4 (four) hours as needed.   Yes [provider]  doxycycline (VIBRAMYCIN) 100 MG capsule Take 1 capsule (100 mg total) by mouth 2 (two) times daily. Patient not taking: Reported on 07/06/2017 11/23/16   Glendell Docker, NP  SUPREP BOWEL PREP SOLN Take 1 kit by mouth once. Brand Name Only.  No Substitutions.  Suprep Bowel Kit. Patient not taking: Reported on 07/06/2017 01/17/16   Irene Shipper, MD    Physical Exam: Vitals:   07/06/17 2030 07/06/17 2230 07/06/17 2300 07/06/17 2345  BP: 131/90 119/77 121/76 125/76  Pulse: 78 77 77 73  Resp:      Temp:      TempSrc:      SpO2: 98% 98% 96% 98%      Constitutional: moderately built and nourished. Vitals:   07/06/17 2030 07/06/17 2230 07/06/17 2300 07/06/17 2345  BP: 131/90 119/77 121/76 125/76  Pulse: 78 77 77 73  Resp:      Temp:      TempSrc:      SpO2: 98% 98% 96% 98%   Eyes: anicteric no pallor. ENMT: no discharge from the ears eyes nose or mouth. Neck: no mass felt.  JVD elevated. Respiratory:  No rhonchi mild crepitations. Cardiovascular: S1-S2 heardno murmurs appreciated. Abdomen: mildly distended nontender bowel sounds present. Musculoskeletal: bilateral lower extremity edema present. Skin: no rash. Skin appears warm. Neurologic:alert awake oriented to time place and person. Moves all extremities. Psychiatric: appears normal. Normal affect.   Labs on Admission: I have personally reviewed following labs and imaging studies  CBC:  Recent Labs Lab 07/06/17 2232  WBC 8.0  NEUTROABS 5.5  HGB 13.6  HCT 40.6  MCV 86.0  PLT 361   Basic Metabolic Panel:  Recent Labs Lab 07/06/17 2232  NA 139  K 3.5  CL 108  CO2 23  GLUCOSE 106*  BUN 12  CREATININE 0.69  CALCIUM 8.8*   GFR: CrCl cannot be calculated (Unknown ideal weight.). Liver  Function Tests:  Recent Labs Lab 07/06/17 2232  AST 25  ALT 24  ALKPHOS 71  BILITOT 1.4*  PROT 7.0  ALBUMIN 3.4*   No results for input(s): LIPASE, AMYLASE in the last 168 hours. No results for input(s): AMMONIA in the last 168 hours. Coagulation Profile: No results for input(s): INR, PROTIME in the last 168 hours. Cardiac Enzymes:  Recent Labs Lab 07/06/17 2232  TROPONINI 0.05*   BNP (last 3 results) No results for input(s): PROBNP in the last 8760 hours. HbA1C: No results for input(s): HGBA1C in the last 72 hours. CBG: No results for input(s): GLUCAP in the last 168 hours. Lipid Profile: No results for input(s): CHOL, HDL, LDLCALC, TRIG, CHOLHDL, LDLDIRECT in the last 72 hours. Thyroid Function Tests: No results for input(s): TSH, T4TOTAL, FREET4, T3FREE, THYROIDAB in the last 72 hours. Anemia Panel: No results for input(s): VITAMINB12, FOLATE, FERRITIN, TIBC, IRON, RETICCTPCT in the last 72 hours. Urine analysis:    Component Value Date/Time   COLORURINE AMBER (A) 12/11/2015 0950   APPEARANCEUR HAZY (A) 12/11/2015 0950   LABSPEC 1.019 12/11/2015 0950   PHURINE 7.0 12/11/2015 0950   GLUCOSEU NEGATIVE 12/11/2015 0950   HGBUR NEGATIVE 12/11/2015 0950   BILIRUBINUR NEGATIVE 12/11/2015 0950   KETONESUR NEGATIVE 12/11/2015 0950   PROTEINUR NEGATIVE 12/11/2015 0950   UROBILINOGEN 0.2 08/26/2014 1731   NITRITE NEGATIVE 12/11/2015 0950   LEUKOCYTESUR NEGATIVE 12/11/2015 0950   Sepsis Labs: '@LABRCNTIP'$ (procalcitonin:4,lacticidven:4) )No results found for this or any previous visit (from the past 240 hour(s)).   Radiological Exams on Admission: Dg Chest 2 View  Result Date: 07/06/2017 CLINICAL DATA:  Shortness of breath for few days. EXAM: CHEST  2 VIEW COMPARISON:  March 1st 2017 FINDINGS: The heart size is mildly enlarged. Mediastinal contour is normal. There is mild bilateral diffuse increased pulmonary interstitium bilaterally. There is no focal pneumonia or or  pleural effusion. Degenerative joint changes of the spine are noted. IMPRESSION: Mild congestive heart failure. Electronically Signed   By: Abelardo Diesel M.D.   On: 07/06/2017 13:31    EKG: Independently reviewed. Normal sinus rhythm with nonspecific T-wave changes.  Assessment/Plan Active Problems:   Acute CHF (congestive heart failure) (HCC)   Chest pain   Essential hypertension   CHF (congestive heart failure) ([place])    1. Acute CHF unknown EF - patient was given Lasix 40 mg IV in the ER. I have placed patient on Lasix 40 mg IV every 12. Closely follow intake and output metabolic panel troponinand check 2-D echo. Check daily weights. 2. Chest pain - given the exertional symptoms concerning for angina. We'll cycle cardiac markers checked d-dimer and 2-D echo. Patient is on aspirin patient is personally chest  pain-free. I have requested cardiology consult. 3. Hypertension - has not been taking her medications. At this time I have placed patient on low-dose lisinopril. Closely follow blood pressure trends. 4. Alcohol abuse - advised to quit alcohol abuse. Patient on CIWA protocol.  I have reviewed patient's old charts and labs in care everywhere.   DVT prophylaxis: Lovenox. Code Status: full code.  Family Communication: discussed with patient.  Disposition Plan: home.  Consults called: cardiology.  Admission status: inpatient.    Rise Patience MD [facility] Pager [phone].  If 7PM-7AM, please contact night-coverage [email or web address] Password [facility]  07/07/2017, 12:43 AM

## 2017-07-08 LAB — BASIC METABOLIC PANEL
ANION GAP: 9 (ref 5–15)
BUN: 13 mg/dL (ref 6–20)
CHLORIDE: 101 mmol/L (ref 101–111)
CO2: 29 mmol/L (ref 22–32)
Calcium: 8.8 mg/dL — ABNORMAL LOW (ref 8.9–10.3)
Creatinine, Ser: 0.85 mg/dL (ref 0.44–1.00)
GFR calc Af Amer: >60 mL/min (ref >60)
GFR calc non Af Amer: >60 mL/min (ref >60)
GLUCOSE: 95 mg/dL (ref 65–99)
POTASSIUM: 3.4 mmol/L — AB (ref 3.5–5.1)
Sodium: 139 mmol/L (ref 135–145)

## 2017-07-08 MED ORDER — SODIUM CHLORIDE 0.9% FLUSH
3.0000 mL | Freq: Two times a day (BID) | INTRAVENOUS | Status: DC
  Administered 2017-07-08 – 2017-07-09 (×2): 3 mL via INTRAVENOUS

## 2017-07-08 MED ORDER — INFLUENZA VAC SPLIT QUAD 0.5 ML IM SUSY
0.5000 mL | PREFILLED_SYRINGE | INTRAMUSCULAR | Status: AC
  Administered 2017-07-10: 0.5 mL via INTRAMUSCULAR
  Filled 2017-07-08: qty 0.5

## 2017-07-08 MED ORDER — SODIUM CHLORIDE 0.9 % IV SOLN
INTRAVENOUS | Status: DC
  Administered 2017-07-09: 06:00:00 via INTRAVENOUS

## 2017-07-08 MED ORDER — SODIUM CHLORIDE 0.9 % WEIGHT BASED INFUSION
0.5000 mL/kg/h | INTRAVENOUS | Status: DC
  Administered 2017-07-08: 0.5 mL/kg/h via INTRAVENOUS

## 2017-07-08 MED ORDER — ASPIRIN 81 MG PO CHEW
81.0000 mg | CHEWABLE_TABLET | ORAL | Status: AC
  Administered 2017-07-09: 81 mg via ORAL
  Filled 2017-07-08: qty 1

## 2017-07-08 MED ORDER — POTASSIUM CHLORIDE CRYS ER 20 MEQ PO TBCR
20.0000 meq | EXTENDED_RELEASE_TABLET | Freq: Two times a day (BID) | ORAL | Status: AC
  Administered 2017-07-08 – 2017-07-09 (×2): 20 meq via ORAL
  Filled 2017-07-08 (×2): qty 1

## 2017-07-08 MED ORDER — SODIUM CHLORIDE 0.9 % IV SOLN: 250.0000 mL | INTRAVENOUS | Status: DC | PRN

## 2017-07-08 MED ORDER — SODIUM CHLORIDE 0.9% FLUSH: 3.0000 mL | INTRAVENOUS | Status: DC | PRN

## 2017-07-08 MED ORDER — SODIUM CHLORIDE 0.9% FLUSH
3.0000 mL | Freq: Two times a day (BID) | INTRAVENOUS | Status: DC
  Administered 2017-07-08: 3 mL via INTRAVENOUS

## 2017-07-08 MED ORDER — ASPIRIN 81 MG PO CHEW: 81.0000 mg | CHEWABLE_TABLET | ORAL | Status: DC

## 2017-07-08 MED ORDER — PNEUMOCOCCAL VAC POLYVALENT 25 MCG/0.5ML IJ INJ
0.5000 mL | INJECTION | INTRAMUSCULAR | Status: AC
  Administered 2017-07-10: 0.5 mL via INTRAMUSCULAR
  Filled 2017-07-08: qty 0.5

## 2017-07-08 NOTE — Progress Notes (Signed)
PROGRESS NOTE    Meredith Shah  GNF:[id number] DOB: 1956-08-10 DOA: 07/06/2017 PCP: Patient, No Pcp Per    Brief Narrative:  61 y.o. female with history of hypertension who has not been following up with her PCP and has not been taking her medications presents to the ER with complaints of shortness of breath and chest pain. Patient states over the last few weeks patient has been having progressive shortness of breath on exertion and on lying down. Denies any fever chills productive cough. Patient has been having some chest pressure which increases on walking and decreases on rest. Presently chest pain-free.  Assessment & Plan:   Active Problems:   Acute CHF (congestive heart failure) [facility]) -  Cardiology consulted. Per cards:  Improving with diuresis.  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 Left and right heart catheterization indicated to assess hemodynamics and exclude coronary disease as a culprit for heart failure.  The patient was counseled to undergo right and left heart catheterization, coronary angiography, and possible percutaneous coronary intervention with stent implantation.     Chest pain - please see above. Cardiology on board and managing - pt on aspirin    Essential hypertension - Pt on lisinopril    DVT prophylaxis: Lovenox Code Status: Full Family Communication: None at bedside.  Disposition Plan: pending improvement in condition   Consultants:   Cardiology   Procedures: pending   Antimicrobials: none   Subjective: Pt has no new complaints reported to me.  Objective: Vitals:   07/07/17 1931 07/08/17 0015 07/08/17 0618 07/08/17 0855  BP: (!) 100/59 112/68 113/86 (!) 95/51  Pulse: 77 76 71 80  Resp: Temp: 98.5 F (36.9 C)  98 F (36.7 C) 98 F (36.7 C)  TempSrc: Oral  Oral Oral  SpO2: 96% 94% 100% 94%  Weight:   124.3 kg (274 lb)   Height:        Intake/Output Summary (Last 24 hours) at  07/08/17 1546 Last data filed at 07/08/17 1048  Gross per 24 hour  Intake              720 ml  Output             3250 ml  Net            -2530 ml   Filed Weights   07/07/17 0219 07/08/17 0618  Weight: 127 kg (280 lb) 124.3 kg (274 lb)    Examination:  General exam: Appears calm and comfortable, in nad. Respiratory system: Clear to auscultation. Respiratory effort normal. Cardiovascular system: S1 & S2 heard, RRR. No JVD, murmurs, rubs, gallops or clicks. + pedal edema. Gastrointestinal system: Abdomen is nondistended, soft and nontender. No organomegaly or masses felt. Normal bowel sounds heard. Central nervous system: Alert and oriented. No focal neurological deficits. Extremities: Symmetric 5 x 5 power. Skin: No rashes, lesions or ulcers, on limited exam. Psychiatry:  Mood & affect appropriate.   Data Reviewed: I have personally reviewed following labs and imaging studies  CBC:  Recent Labs Lab 07/06/17 2232 07/07/17 0244 07/07/17 0633  WBC 8.0 9.0 8.3  NEUTROABS 5.5  --   --   HGB 13.6 13.7 13.4  HCT 40.6 40.9 40.8  MCV 86.0 86.5 87.6  PLT 188 192 190   Basic Metabolic Panel:  Recent Labs Lab 07/06/17 2232 07/07/17 0244 07/08/17 0705  NA 139  --  139  K 3.5  --  3.4*  CL  108  --  101  CO2 23  --  29  GLUCOSE 106*  --  95  BUN 12  --  13  CREATININE 0.69 0.98 0.85  CALCIUM 8.8*  --  8.8*  MG  --  1.7  --    GFR: Estimated Creatinine Clearance: 95.2 mL/min (by C-G formula based on SCr of 0.85 mg/dL). Liver Function Tests:  Recent Labs Lab 07/06/17 2232  AST 25  ALT 24  ALKPHOS 71  BILITOT 1.4*  PROT 7.0  ALBUMIN 3.4*   No results for input(s): LIPASE, AMYLASE in the last 168 hours. No results for input(s): AMMONIA in the last 168 hours. Coagulation Profile: No results for input(s): INR, PROTIME in the last 168 hours. Cardiac Enzymes:  Recent Labs Lab 07/06/17 2232 07/07/17 0244 07/07/17 0633 07/07/17 1208  TROPONINI 0.05* 0.03* <0.03  <0.03   BNP (last 3 results) No results for input(s): PROBNP in the last 8760 hours. HbA1C: No results for input(s): HGBA1C in the last 72 hours. CBG: No results for input(s): GLUCAP in the last 168 hours. Lipid Profile: No results for input(s): CHOL, HDL, LDLCALC, TRIG, CHOLHDL, LDLDIRECT in the last 72 hours. Thyroid Function Tests:  Recent Labs  07/07/17 0244  TSH 1.963   Anemia Panel: No results for input(s): VITAMINB12, FOLATE, FERRITIN, TIBC, IRON, RETICCTPCT in the last 72 hours. Sepsis Labs: No results for input(s): PROCALCITON, LATICACIDVEN in the last 168 hours.  No results found for this or any previous visit (from the past 240 hour(s)).       Radiology Studies: No results found.      Scheduled Meds: . aspirin EC  81 mg Oral Daily  . enoxaparin (LOVENOX) injection  40 mg Subcutaneous Q24H  . folic acid  1 mg Oral Daily  . furosemide  40 mg Intravenous Q12H  . [START ON 07/10/2017] Influenza vac split quadrivalent PF  0.5 mL Intramuscular Tomorrow-1000  . lisinopril  5 mg Oral Daily  . LORazepam  0-4 mg Oral Q6H   Followed by  . [START ON 07/09/2017] LORazepam  0-4 mg Oral Q12H  . multivitamin with minerals  1 tablet Oral Daily  . [START ON 07/10/2017] pneumococcal 23 valent vaccine  0.5 mL Intramuscular Tomorrow-1000  . potassium chloride  10 mEq Oral Daily  . potassium chloride  20 mEq Oral BID  . thiamine  100 mg Oral Daily   Or  . thiamine  100 mg Intravenous Daily   Continuous Infusions:   LOS: 1 day   Time spent: > 35 minutes  Penny Pia, MD [facility] Pager [phone]  If 7PM-7AM, please contact night-coverage [email or web address] Password [facility] 07/08/2017, 3:46 PM

## 2017-07-08 NOTE — Progress Notes (Signed)
The patient has been seen in conjunction with Vin Bhagat, PAC. All aspects of care have been considered and discussed. The patient has been personally interviewed, examined, and all clinical data has been reviewed.   Improving with diuresis.  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 Left and right heart catheterization indicated to assess hemodynamics and exclude coronary disease as a culprit for heart failure. The patient was counseled to undergo right and left heart catheterization, coronary angiography, and possible percutaneous coronary intervention with stent implantation. The procedural risks and benefits were discussed in detail. The risks discussed included death, stroke, myocardial infarction, life-threatening bleeding, limb ischemia, kidney injury, allergy, and possible emergency cardiac surgery. The risk of these significant complications were estimated to occur less than 1% of the time. After discussion, the patient has agreed to proceed.   Progress Note  Patient Name: Meredith Shah Date of Encounter: 07/08/2017  Primary Cardiologist:[facility] - Dr. Sharol Roussel  Subjective   Breathing improved, close to normal. No chest pain or palpitations.   Inpatient Medications    Scheduled Meds: . aspirin EC  81 mg Oral Daily  . enoxaparin (LOVENOX) injection  40 mg Subcutaneous Q24H  . folic acid  1 mg Oral Daily  . furosemide  40 mg Intravenous Q12H  . Influenza vac split quadrivalent PF  0.5 mL Intramuscular Tomorrow-1000  . lisinopril  5 mg Oral Daily  . LORazepam  0-4 mg Oral Q6H   Followed by  . [START ON 07/09/2017] LORazepam  0-4 mg Oral Q12H  . multivitamin with minerals  1 tablet Oral Daily  . pneumococcal 23 valent vaccine  0.5 mL Intramuscular Tomorrow-1000  . potassium chloride  10 mEq Oral Daily  . thiamine  100 mg Oral Daily   Or  . thiamine  100 mg Intravenous Daily   Continuous Infusions:  PRN Meds: acetaminophen **OR**  acetaminophen, LORazepam **OR** LORazepam, ondansetron **OR** ondansetron (ZOFRAN) IV   Vital Signs    Vitals:   07/07/17 1931 07/08/17 0015 07/08/17 0618 07/08/17 0855  BP: (!) 100/59 112/68 113/86 (!) 95/51  Pulse: 77 76 71 80  Resp: 18 18 18 20   Temp: 98.5 F (36.9 C)  98 F (36.7 C) 98 F (36.7 C)  TempSrc: Oral  Oral Oral  SpO2: 96% 94% 100% 94%  Weight:   274 lb (124.3 kg)   Height:        Intake/Output Summary (Last 24 hours) at 07/08/17 0947 Last data filed at 07/08/17 0902  Gross per 24 hour  Intake              720 ml  Output             2250 ml  Net            -1530 ml   Filed Weights   07/07/17 0219 07/08/17 0618  Weight: 280 lb (127 kg) 274 lb (124.3 kg)    Telemetry    SR - Personally Reviewed  ECG    None today  - Personally Reviewed  Physical Exam   GEN: Obese female in no acute distress.   Neck: No JVD Cardiac: RRR, no murmurs, rubs, or gallops. Trace BL LE edema.  Respiratory: Clear to auscultation bilaterally. GI: Soft, nontender, non-distended  MS: No edema; No deformity. Neuro:  Nonfocal  Psych: Normal affect   Labs    Chemistry Recent Labs Lab 07/06/17 2232 07/07/17 0244 07/08/17 0705  NA 139  --  139  K 3.5  --  3.4*  CL 108  --  101  CO2 23  --  29  GLUCOSE 106*  --  95  BUN 12  --  13  CREATININE 0.69 0.98 0.85  CALCIUM 8.8*  --  8.8*  PROT 7.0  --   --   ALBUMIN 3.4*  --   --   AST 25  --   --   ALT 24  --   --   ALKPHOS 71  --   --   BILITOT 1.4*  --   --   GFRNONAA >60 >60 >60  GFRAA >60 >60 >60  ANIONGAP 8  --  9     Hematology Recent Labs Lab 07/06/17 2232 07/07/17 0244 07/07/17 0633  WBC 8.0 9.0 8.3  RBC 4.72 4.73 4.66  HGB 13.6 13.7 13.4  HCT 40.6 40.9 40.8  MCV 86.0 86.5 87.6  MCH 28.8 29.0 28.8  MCHC 33.5 33.5 32.8  RDW 13.8 13.5 13.9  PLT 188 192 190    Cardiac Enzymes Recent Labs Lab 07/06/17 2232 07/07/17 0244 07/07/17 0633 07/07/17 1208  TROPONINI 0.05* 0.03* <0.03 <0.03   No  results for input(s): TROPIPOC in the last 168 hours.   BNP Recent Labs Lab 07/06/17 2232  BNP 191.4*     DDimer  Recent Labs Lab 07/07/17 0244  DDIMER 0.40     Radiology    Dg Chest 2 View  Result Date: 07/06/2017 CLINICAL DATA:  Shortness of breath for few days. EXAM: CHEST  2 VIEW COMPARISON:  March 1st 2017 FINDINGS: The heart size is mildly enlarged. Mediastinal contour is normal. There is mild bilateral diffuse increased pulmonary interstitium bilaterally. There is no focal pneumonia or or pleural effusion. Degenerative joint changes of the spine are noted. IMPRESSION: Mild congestive heart failure. Electronically Signed   By: Sherian Rein M.D.   On: 07/06/2017 13:31    Cardiac Studies   Echo 07/07/17 Study Conclusions  - Left ventricle: The cavity size was mildly dilated. Wall   thickness was normal. Systolic function was mildly to moderately   reduced. The estimated ejection fraction was in the range of 40%   to 45%. Diffuse hypokinesis. Left ventricular diastolic function   parameters were normal. - Left atrium: The atrium was mildly dilated. - Atrial septum: No defect or patent foramen ovale was identified  Patient Profile     61 y.o. female with past medical history of HTN and HLD (diet-controlled) presented with with a 1+ year history of worsening dyspnea but has noted acute orthopnea, PND, and abdominal distension over the past 2 weeks. Weight up 10 lbs from baseline. Also has chest pain.   Assessment & Plan    1. Acute systolic CHF -  BNP slightly elevated to 191. She diuresed negative 1.2L on IV lasix  BID. Weight down 6 lb (280-->274lb). Baseline weight of 270lb. Close to euvolemic.  - Echo showed LVEF of 40-45% with diffused hypokinesis. She will need ischemic evaluation. She ate today. NPO after MN. Will discuss plan with MD.  - Continue lisinopril  qd. ADD low dose coreg when BP stable.   2. Chest pain - Cyclic troponin values have been flat  at 0.05, 0.03, and < 0.03.  3. HTN - she was hypertensive on presentation. BP low today. As above.   4. HLD - Lipid Panel in 10/2015 showed total cholesterol of 234, HDL 53, and LDL 154. - Repeat lab in AM  5. Obesity -  Estimated body mass index is 44.9 kg/m as calculated from the following:   Height as of this encounter: 5' 5.5" (1.664 m).   Weight as of this encounter: 274 lb (124.3 kg).  - Encouraged weight loss and daily exercise. Recommended outpatient sleep study.  For questions or updates, please contact [facility] Please consult [email or web address] for contact info under Cardiology/STEMI.      Signed, Manson Passey, PA  07/08/2017, 9:47 AM

## 2017-07-08 NOTE — Progress Notes (Signed)
[place]ath offered pt stated that she will take it later

## 2017-07-08 NOTE — Plan of Care (Signed)
Problem: Food- and Nutrition-Related Knowledge Deficit (NB-1.1) Goal: Nutrition education Formal process to instruct or train a patient/client in a skill or to impart knowledge to help patients/clients voluntarily manage or modify food choices and eating behavior to maintain or improve health. Outcome: Completed/Met Date Met: 07/08/17 Nutrition Education Note  RD consulted for nutrition education regarding new onset CHF.  RD provided "Heart Failure Nutrition Therapy" handout from the [employer]. Reviewed patient's dietary recall. Pt consumes two meals per day and snacks including cookies and crackers. Pt reports enjoying [place], [place], and [employer]. Pt reports preparing most meals however eats out when at work.  Provided examples on ways to decrease sodium intake in diet. Discouraged intake of processed foods and use of salt shaker.  Pt reports cooking with salt, provided examples of alternative seasonings and flavorings, pt states "I like those things". Pt reports not reading a food label typically, RD recommended pt to do so.  Encouraged fresh fruits and vegetables as well as whole grain sources of carbohydrates to maximize fiber intake. Pt reports enjoying salads and has been trying to decrease "bad" foods.  RD discussed why it is important for patient to adhere to diet recommendations, and emphasized the role of fluids, foods to avoid, and importance of weighing self daily. Teach back method used.  Expect fair compliance.  Body mass index is 44.9 kg/m. Pt meets criteria for morbid obesity based on current BMI.  Current diet order is heart healthy, patient is consuming approximately 100% of meals at this time. Labs and medications reviewed.   No further nutrition interventions warranted at this time. RD contact information provided. If additional nutrition issues arise, please re-consult RD.   Parks Ranger, MS, RDN, LDN 07/08/2017 11:06 AM

## 2017-07-09 ENCOUNTER — Encounter (HOSPITAL_COMMUNITY): Payer: Self-pay | Admitting: Interventional Cardiology

## 2017-07-09 ENCOUNTER — Inpatient Hospital Stay (HOSPITAL_COMMUNITY)
Admission: EM | Disposition: A | Discharge status: Home / Self Care | Payer: Self-pay | Source: Home / Self Care | Attending: Family Medicine

## 2017-07-09 DIAGNOSIS — E785 Hyperlipidemia, unspecified: Secondary | ICD-10-CM

## 2017-07-09 DIAGNOSIS — I5023 Acute on chronic systolic (congestive) heart failure: Secondary | ICD-10-CM

## 2017-07-09 HISTORY — PX: RIGHT/LEFT HEART CATH AND CORONARY ANGIOGRAPHY: CATH118266

## 2017-07-09 LAB — POCT I-STAT 3, VENOUS BLOOD GAS (G3P V)
ACID-BASE EXCESS: 4 mmol/L — AB (ref 0.0–2.0)
BICARBONATE: 30.6 mmol/L — AB (ref 20.0–28.0)
O2 SAT: 73 %
PO2 VEN: 41 mmHg (ref 32.0–45.0)
TCO2: 32 mmol/L (ref 22–32)
pCO2, Ven: 52.3 mmHg (ref 44.0–60.0)
pH, Ven: 7.374 (ref 7.250–7.430)

## 2017-07-09 LAB — POCT I-STAT 3, ART BLOOD GAS (G3+)
ACID-BASE EXCESS: 3 mmol/L — AB (ref 0.0–2.0)
Acid-base deficit: 1 mmol/L (ref 0.0–2.0)
BICARBONATE: 25.9 mmol/L (ref 20.0–28.0)
Bicarbonate: 30.2 mmol/L — ABNORMAL HIGH (ref 20.0–28.0)
O2 SAT: 90 %
O2 SAT: 95 %
TCO2: 32 mmol/L (ref 22–32)
TCO2: 27 mmol/L (ref 22–32)
pCO2 arterial: 54.4 mmHg — ABNORMAL HIGH (ref 32.0–48.0)
pCO2 arterial: 48.2 mmHg — ABNORMAL HIGH (ref 32.0–48.0)
pH, Arterial: 7.352 (ref 7.350–7.450)
pH, Arterial: 7.338 — ABNORMAL LOW (ref 7.350–7.450)
pO2, Arterial: 63 mmHg — ABNORMAL LOW (ref 83.0–108.0)
pO2, Arterial: 80 mmHg — ABNORMAL LOW (ref 83.0–108.0)

## 2017-07-09 LAB — BASIC METABOLIC PANEL
ANION GAP: 9 (ref 5–15)
BUN: 9 mg/dL (ref 6–20)
CO2: 28 mmol/L (ref 22–32)
Calcium: 8.5 mg/dL — ABNORMAL LOW (ref 8.9–10.3)
Chloride: 102 mmol/L (ref 101–111)
Creatinine, Ser: 0.75 mg/dL (ref 0.44–1.00)
GLUCOSE: 102 mg/dL — AB (ref 65–99)
POTASSIUM: 3.7 mmol/L (ref 3.5–5.1)
Sodium: 139 mmol/L (ref 135–145)

## 2017-07-09 LAB — PROTIME-INR
INR: 1.04
PROTHROMBIN TIME: 13.5 s (ref 11.4–15.2)

## 2017-07-09 LAB — LIPID PANEL
CHOL/HDL RATIO: 4.8 ratio
Cholesterol: 198 mg/dL (ref 0–200)
HDL: 41 mg/dL (ref >40)
LDL Cholesterol: 135 mg/dL — ABNORMAL HIGH (ref 0–99)
TRIGLYCERIDES: 112 mg/dL (ref <150)
VLDL: 22 mg/dL (ref 0–40)

## 2017-07-09 SURGERY — RIGHT/LEFT HEART CATH AND CORONARY ANGIOGRAPHY
Anesthesia: LOCAL

## 2017-07-09 MED ORDER — MIDAZOLAM HCL 2 MG/2ML IJ SOLN
INTRAMUSCULAR | Status: DC | PRN
  Administered 2017-07-09 (×2): 1 mg via INTRAVENOUS

## 2017-07-09 MED ORDER — ONDANSETRON HCL 4 MG/2ML IJ SOLN: 4.0000 mg | Freq: Four times a day (QID) | INTRAMUSCULAR | Status: DC | PRN

## 2017-07-09 MED ORDER — HEPARIN (PORCINE) IN NACL 2-0.9 UNIT/ML-% IJ SOLN
INTRAMUSCULAR | Status: AC
  Filled 2017-07-09: qty 1000

## 2017-07-09 MED ORDER — ACETAMINOPHEN 325 MG PO TABS: 650.0000 mg | ORAL_TABLET | ORAL | Status: DC | PRN

## 2017-07-09 MED ORDER — HEPARIN SODIUM (PORCINE) 1000 UNIT/ML IJ SOLN
INTRAMUSCULAR | Status: DC | PRN
  Administered 2017-07-09: 6000 [IU] via INTRAVENOUS

## 2017-07-09 MED ORDER — FENTANYL CITRATE (PF) 100 MCG/2ML IJ SOLN
INTRAMUSCULAR | Status: AC
  Filled 2017-07-09: qty 2

## 2017-07-09 MED ORDER — VERAPAMIL HCL 2.5 MG/ML IV SOLN
INTRAVENOUS | Status: DC | PRN
  Administered 2017-07-09: 17:00:00 via INTRA_ARTERIAL

## 2017-07-09 MED ORDER — HEPARIN SODIUM (PORCINE) 1000 UNIT/ML IJ SOLN
INTRAMUSCULAR | Status: AC
  Filled 2017-07-09: qty 1

## 2017-07-09 MED ORDER — MIDAZOLAM HCL 2 MG/2ML IJ SOLN
INTRAMUSCULAR | Status: AC
  Filled 2017-07-09: qty 2

## 2017-07-09 MED ORDER — SODIUM CHLORIDE 0.9% FLUSH: 3.0000 mL | INTRAVENOUS | Status: DC | PRN

## 2017-07-09 MED ORDER — ENOXAPARIN SODIUM 40 MG/0.4ML ~~LOC~~ SOLN
40.0000 mg | SUBCUTANEOUS | Status: DC
  Administered 2017-07-10: 40 mg via SUBCUTANEOUS
  Filled 2017-07-09: qty 0.4

## 2017-07-09 MED ORDER — LIDOCAINE HCL 2 % IJ SOLN
INTRAMUSCULAR | Status: AC
  Filled 2017-07-09: qty 10

## 2017-07-09 MED ORDER — IOPAMIDOL (ISOVUE-370) INJECTION 76%
INTRAVENOUS | Status: DC | PRN
  Administered 2017-07-09: 54 mL via INTRA_ARTERIAL

## 2017-07-09 MED ORDER — IOPAMIDOL (ISOVUE-370) INJECTION 76%
INTRAVENOUS | Status: AC
  Filled 2017-07-09: qty 100

## 2017-07-09 MED ORDER — SODIUM CHLORIDE 0.9 % IV SOLN
INTRAVENOUS | Status: AC
  Administered 2017-07-09: 18:00:00 via INTRAVENOUS

## 2017-07-09 MED ORDER — DIPHENHYDRAMINE HCL 25 MG PO CAPS
25.0000 mg | ORAL_CAPSULE | Freq: Once | ORAL | Status: AC
  Administered 2017-07-09: 25 mg via ORAL
  Filled 2017-07-09: qty 1

## 2017-07-09 MED ORDER — OXYCODONE HCL 5 MG PO TABS: 5.0000 mg | ORAL_TABLET | ORAL | Status: DC | PRN

## 2017-07-09 MED ORDER — FENTANYL CITRATE (PF) 100 MCG/2ML IJ SOLN
INTRAMUSCULAR | Status: DC | PRN
  Administered 2017-07-09: 50 ug via INTRAVENOUS

## 2017-07-09 MED ORDER — SODIUM CHLORIDE 0.9 % IV SOLN: 250.0000 mL | INTRAVENOUS | Status: DC | PRN

## 2017-07-09 MED ORDER — LIDOCAINE HCL (PF) 1 % IJ SOLN
INTRAMUSCULAR | Status: DC | PRN
  Administered 2017-07-09 (×2): 2 mL

## 2017-07-09 MED ORDER — VERAPAMIL HCL 2.5 MG/ML IV SOLN
INTRAVENOUS | Status: AC
  Filled 2017-07-09: qty 2

## 2017-07-09 MED ORDER — SODIUM CHLORIDE 0.9% FLUSH
3.0000 mL | Freq: Two times a day (BID) | INTRAVENOUS | Status: DC
  Administered 2017-07-09 – 2017-07-10 (×2): 3 mL via INTRAVENOUS

## 2017-07-09 MED ORDER — HEPARIN (PORCINE) IN NACL 2-0.9 UNIT/ML-% IJ SOLN
INTRAMUSCULAR | Status: DC | PRN
Start: 2017-07-09 — End: 2017-07-09
  Administered 2017-07-09: 17:00:00

## 2017-07-09 SURGICAL SUPPLY — 15 items
CATH BALLN WEDGE 5F 110CM (CATHETERS) ×1 IMPLANT
CATH EXPO 5F FL3.5 (CATHETERS) ×1 IMPLANT
CATH INFINITI JR4 5F (CATHETERS) ×1 IMPLANT
COVER PRB 48X5XTLSCP FOLD TPE (BAG) IMPLANT
COVER PROBE 5X48 (BAG) ×2
DEVICE RAD COMP TR BAND LRG (VASCULAR PRODUCTS) ×1 IMPLANT
GLIDESHEATH SLEND A-KIT 6F 22G (SHEATH) ×1 IMPLANT
GUIDEWIRE INQWIRE 1.5J.035X260 (WIRE) IMPLANT
HOVERMATT SINGLE USE (MISCELLANEOUS) ×1 IMPLANT
INQWIRE 1.5J .035X260CM (WIRE) ×2
KIT HEART LEFT (KITS) ×2 IMPLANT
PACK CARDIAC CATHETERIZATION (CUSTOM PROCEDURE TRAY) ×2 IMPLANT
SHEATH GLIDE SLENDER 4/5FR (SHEATH) ×1 IMPLANT
TRANSDUCER W/STOPCOCK (MISCELLANEOUS) ×2 IMPLANT
TUBING CIL FLEX 10 FLL-RA (TUBING) ×2 IMPLANT

## 2017-07-09 NOTE — Progress Notes (Signed)
Patient stable, new RN talking over care.

## 2017-07-09 NOTE — Progress Notes (Signed)
PROGRESS NOTE    Meredith Shah  SUP:[id number] DOB: [birth year] DOA: 07/06/2017 PCP: Patient, No Pcp Per    Brief Narrative:  61 y.o. female with history of hypertension who has not been following up with her PCP and has not been taking her medications presents to the ER with complaints of shortness of breath and chest pain. Patient states over the last few weeks patient has been having progressive shortness of breath on exertion and on lying down. Denies any fever chills productive cough. Patient has been having some chest pressure which increases on walking and decreases on rest. Presently chest pain-free.  Assessment & Plan:   Active Problems:   Acute CHF (congestive heart failure) [facility]) -  Cardiology consulted. Per cards:  Improving with diuresis. 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 Left and right heart catheterization indicated to assess hemodynamics and exclude coronary disease as a culprit for heart failure. This is to be done today.  The patient was counseled to undergo right and left heart catheterization, coronary angiography, and possible percutaneous coronary intervention with stent implantation.     Chest pain - please see above. Cardiology on board and managing - pt on aspirin    Essential hypertension - Pt on lisinopril    DVT prophylaxis: Lovenox Code Status: Full Family Communication: None at bedside.  Disposition Plan: Pending cardiology evaluation   Consultants:   Cardiology   Procedures: pending   Antimicrobials: none   Subjective: Pt has no new complaints.   Objective: Vitals:   07/08/17 2050 07/09/17 0444 07/09/17 0838 07/09/17 1158  BP: 110/73 111/79 120/69 95/62  Pulse: 73 72 82 73  Resp:  18    Temp: 98.2 F (36.8 C) 97.7 F (36.5 C) 98.2 F (36.8 C) 98.3 F (36.8 C)  TempSrc: Oral Oral Oral Oral  SpO2: 94% 99% 97% 98%  Weight:  124.7 kg (274 lb 14.4 oz)    Height:         Intake/Output Summary (Last 24 hours) at 07/09/17 1617 Last data filed at 07/09/17 1400  Gross per 24 hour  Intake          1040.71 ml  Output             3370 ml  Net         -2329.29 ml   Filed Weights   07/07/17 0219 07/08/17 0618 07/09/17 0444  Weight: 127 kg (280 lb) 124.3 kg (274 lb) 124.7 kg (274 lb 14.4 oz)    Examination:  General exam: Appears calm and comfortable, in nad. Respiratory system: Clear to auscultation. Respiratory effort normal. Cardiovascular system: S1 & S2 heard, RRR. No JVD, murmurs, rubs, gallops or clicks. + pedal edema. Gastrointestinal system: Abdomen is nondistended, soft and nontender. No organomegaly or masses felt. Normal bowel sounds heard. Central nervous system: Alert and oriented. No focal neurological deficits. Extremities: Symmetric 5 x 5 power. Skin: No rashes, lesions or ulcers, on limited exam. Psychiatry:  Mood & affect appropriate.   Data Reviewed: I have personally reviewed following labs and imaging studies  CBC:  Recent Labs Lab 07/06/17 2232 07/07/17 0244 07/07/17 0633  WBC 8.0 9.0 8.3  NEUTROABS 5.5  --   --   HGB 13.6 13.7 13.4  HCT 40.6 40.9 40.8  MCV 86.0 86.5 87.6  PLT 188 192 190   Basic Metabolic Panel:  Recent Labs Lab 07/06/17 2232 07/07/17 0244 07/08/17 0705 07/09/17 0528  NA 139  --  139 139  K 3.5  --  3.4* 3.7  CL 108  --  101 102  CO2 23  --  29 28  GLUCOSE 106*  --  95 102*  BUN 12  --  13 9  CREATININE 0.69 0.98 0.85 0.75  CALCIUM 8.8*  --  8.8* 8.5*  MG  --  1.7  --   --    GFR: Estimated Creatinine Clearance: 101.4 mL/min (by C-G formula based on SCr of 0.75 mg/dL). Liver Function Tests:  Recent Labs Lab 07/06/17 2232  AST 25  ALT 24  ALKPHOS 71  BILITOT 1.4*  PROT 7.0  ALBUMIN 3.4*   No results for input(s): LIPASE, AMYLASE in the last 168 hours. No results for input(s): AMMONIA in the last 168 hours. Coagulation Profile:  Recent Labs Lab 07/09/17 0921  INR 1.04    Cardiac Enzymes:  Recent Labs Lab 07/06/17 2232 07/07/17 0244 07/07/17 0633 07/07/17 1208  TROPONINI 0.05* 0.03* <0.03 <0.03   BNP (last 3 results) No results for input(s): PROBNP in the last 8760 hours. HbA1C: No results for input(s): HGBA1C in the last 72 hours. CBG: No results for input(s): GLUCAP in the last 168 hours. Lipid Profile:  Recent Labs  07/09/17 0528  CHOL 198  HDL 41  LDLCALC 135*  TRIG 112  CHOLHDL 4.8   Thyroid Function Tests:  Recent Labs  07/07/17 0244  TSH 1.963   Anemia Panel: No results for input(s): VITAMINB12, FOLATE, FERRITIN, TIBC, IRON, RETICCTPCT in the last 72 hours. Sepsis Labs: No results for input(s): PROCALCITON, LATICACIDVEN in the last 168 hours.  No results found for this or any previous visit (from the past 240 hour(s)).       Radiology Studies: No results found.      Scheduled Meds: . aspirin EC  81 mg Oral Daily  . enoxaparin (LOVENOX) injection  40 mg Subcutaneous Q24H  . folic acid  1 mg Oral Daily  . furosemide  40 mg Intravenous Q12H  . [START ON 07/10/2017] Influenza vac split quadrivalent PF  0.5 mL Intramuscular Tomorrow-1000  . lisinopril  5 mg Oral Daily  . LORazepam  0-4 mg Oral Q12H  . multivitamin with minerals  1 tablet Oral Daily  . [START ON 07/10/2017] pneumococcal 23 valent vaccine  0.5 mL Intramuscular Tomorrow-1000  . sodium chloride flush  3 mL Intravenous Q12H  . sodium chloride flush  3 mL Intravenous Q12H  . thiamine  100 mg Oral Daily   Or  . thiamine  100 mg Intravenous Daily   Continuous Infusions: . sodium chloride Stopped (07/08/17 2024)  . sodium chloride Stopped (07/08/17 2024)  . sodium chloride 10 mL/hr at 07/09/17 0622  . sodium chloride Stopped (07/09/17 0621)     LOS: 2 days   Time spent: > 35 minutes  Penny Pia, MD [facility] Pager [phone]  If 7PM-7AM, please contact night-coverage [email or web address] Password [facility] 07/09/2017, 4:17 PM

## 2017-07-09 NOTE — Progress Notes (Signed)
The patient has been seen in conjunction with Corine Shelter, PA-C. All aspects of care have been considered and discussed. The patient has been personally interviewed, examined, and all clinical data has been reviewed.   Improving slowly with diuresis.  Agree with proceeding with right and left heart cath today to get hemodynamic confirmation of disease process and to exclude CAD. The patient was counseled to undergo left heart catheterization, coronary angiography, and possible percutaneous coronary intervention with stent implantation. The procedural risks and benefits were discussed in detail. The risks discussed included death, stroke, myocardial infarction, life-threatening bleeding, limb ischemia, kidney injury, allergy, and possible emergency cardiac surgery. The risk of these significant complications were estimated to occur less than 1% of the time. After discussion, the patient has agreed to proceed.  Progress Note  Patient Name: Meredith Shah Date of Encounter: 07/09/2017  Primary Cardiologist:[facility] - Dr. Sharol Roussel  Subjective   Breathing improved, close to normal. No chest pain or palpitations.   Inpatient Medications    Scheduled Meds: . aspirin EC  81 mg Oral Daily  . enoxaparin (LOVENOX) injection  40 mg Subcutaneous Q24H  . folic acid  1 mg Oral Daily  . furosemide  40 mg Intravenous Q12H  . [START ON 07/10/2017] Influenza vac split quadrivalent PF  0.5 mL Intramuscular Tomorrow-1000  . lisinopril  5 mg Oral Daily  . LORazepam  0-4 mg Oral Q12H  . multivitamin with minerals  1 tablet Oral Daily  . [START ON 07/10/2017] pneumococcal 23 valent vaccine  0.5 mL Intramuscular Tomorrow-1000  . sodium chloride flush  3 mL Intravenous Q12H  . sodium chloride flush  3 mL Intravenous Q12H  . thiamine  100 mg Oral Daily   Or  . thiamine  100 mg Intravenous Daily   Continuous Infusions: . sodium chloride Stopped (07/08/17 2024)  . sodium chloride Stopped (07/08/17 2024)    . sodium chloride 10 mL/hr at 07/09/17 0622  . sodium chloride Stopped (07/09/17 0621)   PRN Meds: sodium chloride, sodium chloride, acetaminophen **OR** acetaminophen, LORazepam **OR** LORazepam, ondansetron **OR** ondansetron (ZOFRAN) IV, sodium chloride flush, sodium chloride flush   Vital Signs    Vitals:   07/08/17 1832 07/08/17 2050 07/09/17 0444 07/09/17 0838  BP: 110/68 110/73 111/79 120/69  Pulse: 78 73 72 82  Resp: 18  18   Temp: 98 F (36.7 C) 98.2 F (36.8 C) 97.7 F (36.5 C) 98.2 F (36.8 C)  TempSrc: Oral Oral Oral Oral  SpO2: 94% 94% 99% 97%  Weight:   274 lb 14.4 oz (124.7 kg)   Height:        Intake/Output Summary (Last 24 hours) at 07/09/17 1018 Last data filed at 07/09/17 6226  Gross per 24 hour  Intake              240 ml  Output             3910 ml  Net            -3670 ml   Filed Weights   07/07/17 0219 07/08/17 0618 07/09/17 0444  Weight: 280 lb (127 kg) 274 lb (124.3 kg) 274 lb 14.4 oz (124.7 kg)    Telemetry    SR - Personally Reviewed  ECG    None today  - Personally Reviewed  Physical Exam   GEN: Obese female in no acute distress.   Neck: No JVD Cardiac: RRR, no murmurs, rubs, or gallops. Trace BL LE edema.  Respiratory: Clear  to auscultation bilaterally. GI: Soft, nontender, non-distended  MS: No edema; No deformity. Neuro:  Nonfocal  Psych: Normal affect   Labs    Chemistry  Recent Labs Lab 07/06/17 2232 07/07/17 0244 07/08/17 0705 07/09/17 0528  NA 139  --  139 139  K 3.5  --  3.4* 3.7  CL 108  --  101 102  CO2 23  --  29 28  GLUCOSE 106*  --  95 102*  BUN 12  --  13 9  CREATININE 0.69 0.98 0.85 0.75  CALCIUM 8.8*  --  8.8* 8.5*  PROT 7.0  --   --   --   ALBUMIN 3.4*  --   --   --   AST 25  --   --   --   ALT 24  --   --   --   ALKPHOS 71  --   --   --   BILITOT 1.4*  --   --   --   GFRNONAA >60 >60 >60 >60  GFRAA >60 >60 >60 >60  ANIONGAP 8  --  9 9     Hematology  Recent Labs Lab 07/06/17 2232  07/07/17 0244 07/07/17 0633  WBC 8.0 9.0 8.3  RBC 4.72 4.73 4.66  HGB 13.6 13.7 13.4  HCT 40.6 40.9 40.8  MCV 86.0 86.5 87.6  MCH 28.8 29.0 28.8  MCHC 33.5 33.5 32.8  RDW 13.8 13.5 13.9  PLT 188 192 190    Cardiac Enzymes  Recent Labs Lab 07/06/17 2232 07/07/17 0244 07/07/17 0633 07/07/17 1208  TROPONINI 0.05* 0.03* <0.03 <0.03   No results for input(s): TROPIPOC in the last 168 hours.   BNP  Recent Labs Lab 07/06/17 2232  BNP 191.4*     DDimer   Recent Labs Lab 07/07/17 0244  DDIMER 0.40     Radiology    No results found.  Cardiac Studies   Echo 07/07/17 Study Conclusions  - Left ventricle: The cavity size was mildly dilated. Wall   thickness was normal. Systolic function was mildly to moderately   reduced. The estimated ejection fraction was in the range of 40%   to 45%. Diffuse hypokinesis. Left ventricular diastolic function   parameters were normal. - Left atrium: The atrium was mildly dilated. - Atrial septum: No defect or patent foramen ovale was identified  Patient Profile     61 y.o.morbidly obese female with past medical history of HTN and HLD (diet-controlled) presented with with a 1+ year history of worsening dyspnea and recent acute orthopnea, PND, and abdominal distension over the past 2 weeks. Weight up 10 lbs from baseline. Also has chest pain.   Assessment & Plan    1. Acute systolic CHF -  BNP slightly elevated to 191. She has diuresed negative 4.9L on IV lasix  BID. Weight down 6 lb (280-->274lb). Baseline weight of 270lb. Close to euvolemic.  - Echo showed LVEF of 40-45% with diffused hypokinesis. She will need ischemic evaluation-Rt and Lt cath today - Continue lisinopril  qd. ADD low dose coreg.   2. Chest pain - Cyclic troponin values have been flat at 0.05, 0.03, and < 0.03.  3. HTN - she was hypertensive on presentation. BP stable today  4. HLD - Lipid Panel in 10/2015 showed total cholesterol of 234, HDL 53,  and LDL 154. - Repeat lab in AM  5. Obesity - Estimated body mass index is 45.05 kg/m as calculated from the following:   Height  as of this encounter: 5' 5.5" (1.664 m).   Weight as of this encounter: 274 lb 14.4 oz (124.7 kg).  - Encouraged weight loss and daily exercise. Recommended outpatient sleep study.   Plan: Rt and Lt cath today (this PM). Continue IV Lasix      Signed, Luke Kilroy, PA-C  07/09/2017, 10:18 AM   

## 2017-07-09 NOTE — Progress Notes (Signed)
TR band off, no bleeding noted.

## 2017-07-09 NOTE — Interval H&P Note (Signed)
Cath Lab Visit (complete for each Cath Lab visit)  Clinical Evaluation Leading to the Procedure:   ACS: No.  Non-ACS:    Anginal Classification: CCS III  Anti-ischemic medical therapy: No Therapy  Non-Invasive Test Results: No non-invasive testing performed  Prior CABG: No previous CABG      History and Physical Interval Note:  07/09/2017 5:02 PM  Meredith Shah  has presented today for surgery, with the diagnosis of chf  The various methods of treatment have been discussed with the patient and family. After consideration of risks, benefits and other options for treatment, the patient has consented to  Procedure(s): RIGHT/LEFT HEART CATH AND CORONARY ANGIOGRAPHY (N/A) as a surgical intervention .  The patient's history has been reviewed, patient examined, no change in status, stable for surgery.  I have reviewed the patient's chart and labs.  Questions were answered to the patient's satisfaction.     Lyn Records III

## 2017-07-09 NOTE — H&P (View-Only) (Signed)
The patient has been seen in conjunction with Corine Shelter, PA-C. All aspects of care have been considered and discussed. The patient has been personally interviewed, examined, and all clinical data has been reviewed.   Improving slowly with diuresis.  Agree with proceeding with right and left heart cath today to get hemodynamic confirmation of disease process and to exclude CAD. The patient was counseled to undergo left heart catheterization, coronary angiography, and possible percutaneous coronary intervention with stent implantation. The procedural risks and benefits were discussed in detail. The risks discussed included death, stroke, myocardial infarction, life-threatening bleeding, limb ischemia, kidney injury, allergy, and possible emergency cardiac surgery. The risk of these significant complications were estimated to occur less than 1% of the time. After discussion, the patient has agreed to proceed.  Progress Note  Patient Name: Meredith Shah Date of Encounter: 07/09/2017  Primary Cardiologist:[facility] - Dr. Sharol Roussel  Subjective   Breathing improved, close to normal. No chest pain or palpitations.   Inpatient Medications    Scheduled Meds: . aspirin EC  81 mg Oral Daily  . enoxaparin (LOVENOX) injection  40 mg Subcutaneous Q24H  . folic acid  1 mg Oral Daily  . furosemide  40 mg Intravenous Q12H  . [START ON 07/10/2017] Influenza vac split quadrivalent PF  0.5 mL Intramuscular Tomorrow-1000  . lisinopril  5 mg Oral Daily  . LORazepam  0-4 mg Oral Q12H  . multivitamin with minerals  1 tablet Oral Daily  . [START ON 07/10/2017] pneumococcal 23 valent vaccine  0.5 mL Intramuscular Tomorrow-1000  . sodium chloride flush  3 mL Intravenous Q12H  . sodium chloride flush  3 mL Intravenous Q12H  . thiamine  100 mg Oral Daily   Or  . thiamine  100 mg Intravenous Daily   Continuous Infusions: . sodium chloride Stopped (07/08/17 2024)  . sodium chloride Stopped (07/08/17 2024)    . sodium chloride 10 mL/hr at 07/09/17 0622  . sodium chloride Stopped (07/09/17 0621)   PRN Meds: sodium chloride, sodium chloride, acetaminophen **OR** acetaminophen, LORazepam **OR** LORazepam, ondansetron **OR** ondansetron (ZOFRAN) IV, sodium chloride flush, sodium chloride flush   Vital Signs    Vitals:   07/08/17 1832 07/08/17 2050 07/09/17 0444 07/09/17 0838  BP: 110/68 110/73 111/79 120/69  Pulse: 78 73 72 82  Resp: 18  18   Temp: 98 F (36.7 C) 98.2 F (36.8 C) 97.7 F (36.5 C) 98.2 F (36.8 C)  TempSrc: Oral Oral Oral Oral  SpO2: 94% 94% 99% 97%  Weight:   274 lb 14.4 oz (124.7 kg)   Height:        Intake/Output Summary (Last 24 hours) at 07/09/17 1018 Last data filed at 07/09/17 6226  Gross per 24 hour  Intake              240 ml  Output             3910 ml  Net            -3670 ml   Filed Weights   07/07/17 0219 07/08/17 0618 07/09/17 0444  Weight: 280 lb (127 kg) 274 lb (124.3 kg) 274 lb 14.4 oz (124.7 kg)    Telemetry    SR - Personally Reviewed  ECG    None today  - Personally Reviewed  Physical Exam   GEN: Obese female in no acute distress.   Neck: No JVD Cardiac: RRR, no murmurs, rubs, or gallops. Trace BL LE edema.  Respiratory: Clear  to auscultation bilaterally. GI: Soft, nontender, non-distended  MS: No edema; No deformity. Neuro:  Nonfocal  Psych: Normal affect   Labs    Chemistry  Recent Labs Lab 07/06/17 2232 07/07/17 0244 07/08/17 0705 07/09/17 0528  NA 139  --  139 139  K 3.5  --  3.4* 3.7  CL 108  --  101 102  CO2 23  --  29 28  GLUCOSE 106*  --  95 102*  BUN 12  --  13 9  CREATININE 0.69 0.98 0.85 0.75  CALCIUM 8.8*  --  8.8* 8.5*  PROT 7.0  --   --   --   ALBUMIN 3.4*  --   --   --   AST 25  --   --   --   ALT 24  --   --   --   ALKPHOS 71  --   --   --   BILITOT 1.4*  --   --   --   GFRNONAA >60 >60 >60 >60  GFRAA >60 >60 >60 >60  ANIONGAP 8  --  9 9     Hematology  Recent Labs Lab 07/06/17 2232  07/07/17 0244 07/07/17 0633  WBC 8.0 9.0 8.3  RBC 4.72 4.73 4.66  HGB 13.6 13.7 13.4  HCT 40.6 40.9 40.8  MCV 86.0 86.5 87.6  MCH 28.8 29.0 28.8  MCHC 33.5 33.5 32.8  RDW 13.8 13.5 13.9  PLT 188 192 190    Cardiac Enzymes  Recent Labs Lab 07/06/17 2232 07/07/17 0244 07/07/17 0633 07/07/17 1208  TROPONINI 0.05* 0.03* <0.03 <0.03   No results for input(s): TROPIPOC in the last 168 hours.   BNP  Recent Labs Lab 07/06/17 2232  BNP 191.4*     DDimer   Recent Labs Lab 07/07/17 0244  DDIMER 0.40     Radiology    No results found.  Cardiac Studies   Echo 07/07/17 Study Conclusions  - Left ventricle: The cavity size was mildly dilated. Wall   thickness was normal. Systolic function was mildly to moderately   reduced. The estimated ejection fraction was in the range of 40%   to 45%. Diffuse hypokinesis. Left ventricular diastolic function   parameters were normal. - Left atrium: The atrium was mildly dilated. - Atrial septum: No defect or patent foramen ovale was identified  Patient Profile     61 y.o.morbidly obese female with past medical history of HTN and HLD (diet-controlled) presented with with a 1+ year history of worsening dyspnea and recent acute orthopnea, PND, and abdominal distension over the past 2 weeks. Weight up 10 lbs from baseline. Also has chest pain.   Assessment & Plan    1. Acute systolic CHF -  BNP slightly elevated to 191. She has diuresed negative 4.9L on IV lasix  BID. Weight down 6 lb (280-->274lb). Baseline weight of 270lb. Close to euvolemic.  - Echo showed LVEF of 40-45% with diffused hypokinesis. She will need ischemic evaluation-Rt and Lt cath today - Continue lisinopril  qd. ADD low dose coreg.   2. Chest pain - Cyclic troponin values have been flat at 0.05, 0.03, and < 0.03.  3. HTN - she was hypertensive on presentation. BP stable today  4. HLD - Lipid Panel in 10/2015 showed total cholesterol of 234, HDL 53,  and LDL 154. - Repeat lab in AM  5. Obesity - Estimated body mass index is 45.05 kg/m as calculated from the following:   Height  as of this encounter: 5' 5.5" (1.664 m).   Weight as of this encounter: 274 lb 14.4 oz (124.7 kg).  - Encouraged weight loss and daily exercise. Recommended outpatient sleep study.   Plan: Rt and Lt cath today (this PM). Continue IV Lasix      Signed, Corine Shelter, PA-C  07/09/2017, 10:18 AM

## 2017-07-10 DIAGNOSIS — I5021 Acute systolic (congestive) heart failure: Secondary | ICD-10-CM

## 2017-07-10 LAB — CBC
HCT: 44.3 % (ref 36.0–46.0)
HEMOGLOBIN: 14.7 g/dL (ref 12.0–15.0)
MCH: 29.1 pg (ref 26.0–34.0)
MCHC: 33.2 g/dL (ref 30.0–36.0)
MCV: 87.5 fL (ref 78.0–100.0)
Platelets: 183 10*3/uL (ref 150–400)
RBC: 5.06 MIL/uL (ref 3.87–5.11)
RDW: 13.6 % (ref 11.5–15.5)
WBC: 8.3 10*3/uL (ref 4.0–10.5)

## 2017-07-10 LAB — BASIC METABOLIC PANEL
ANION GAP: 9 (ref 5–15)
BUN: 14 mg/dL (ref 6–20)
CALCIUM: 8.8 mg/dL — AB (ref 8.9–10.3)
CHLORIDE: 103 mmol/L (ref 101–111)
CO2: 26 mmol/L (ref 22–32)
Creatinine, Ser: 0.92 mg/dL (ref 0.44–1.00)
GFR calc non Af Amer: >60 mL/min (ref >60)
Glucose, Bld: 101 mg/dL — ABNORMAL HIGH (ref 65–99)
POTASSIUM: 3.6 mmol/L (ref 3.5–5.1)
Sodium: 138 mmol/L (ref 135–145)

## 2017-07-10 MED ORDER — FUROSEMIDE 40 MG PO TABS: 40.0000 mg | ORAL_TABLET | Freq: Two times a day (BID) | ORAL | Status: DC

## 2017-07-10 MED ORDER — METOPROLOL SUCCINATE ER 25 MG PO TB24: 12.5000 mg | ORAL_TABLET | Freq: Every day | ORAL | 0 refills | Status: DC

## 2017-07-10 MED ORDER — ASPIRIN 81 MG PO TBEC: 81.0000 mg | DELAYED_RELEASE_TABLET | Freq: Every day | ORAL | 0 refills | Status: DC

## 2017-07-10 MED ORDER — METOPROLOL SUCCINATE ER 25 MG PO TB24
12.5000 mg | ORAL_TABLET | Freq: Every day | ORAL | Status: DC
  Administered 2017-07-10: 12.5 mg via ORAL
  Filled 2017-07-10: qty 1

## 2017-07-10 MED ORDER — FUROSEMIDE 40 MG PO TABS
40.0000 mg | ORAL_TABLET | Freq: Two times a day (BID) | ORAL | 0 refills | Status: DC
Start: 2017-07-11 — End: 2017-08-11

## 2017-07-10 MED ORDER — LISINOPRIL 5 MG PO TABS: 5.0000 mg | ORAL_TABLET | Freq: Every day | ORAL | 0 refills | Status: DC

## 2017-07-10 NOTE — Discharge Summary (Signed)
Physician Discharge Summary  Meredith Shah LKT:[id number] DOB: [birth year] DOA: 07/06/2017  PCP: Meredith Shah, No Pcp Per  Admit date: 07/06/2017 Discharge date: 07/10/2017  Time spent: > 35 minutes  Recommendations for Outpatient Follow-up:  Please be sure the Meredith Shah follows up with cardiology 2 or 3 weeks after hospital discharge Monitor blood pressures as Meredith Shah was placed on new medication for CHF  Discharge Diagnoses:  Principal Problem:   Acute CHF (congestive heart failure) ([place]) systolic after evaluation Active Problems:   Chest pain   Essential hypertension   CHF (congestive heart failure) ([place])   Morbid obesity ([place])   Discharge Condition: Stable  Diet recommendation: Low sodium diet/heart healthy  Filed Weights   07/08/17 0618 07/09/17 0444 07/10/17 0420  Weight: 124.3 kg (274 lb) 124.7 kg (274 lb 14.4 oz) 123.2 kg (271 lb 11.2 oz)    History of present illness:  Meredith Shah is a 61 year old female who presented with main complaint of shortness of breath and diagnosed with acute systolic congestive heart failure. Cardiology consulted while Meredith Shah was in [place] [facility] Course:  Active Problems:    Acute CHF (congestive heart failure) [facility]) -  Cardiology consulted. Per cards: . Acute systolic HF - 12/4285 echo LVEF 40-45% - 06/2017 LHC/RHC: normal coronaries. CI 3.3, PCWP 15, LVEDP 22 - negative 2.1 liters yesterday, negative 6.4 liters since admission. He is IV lasix 66m bid, slight uptrend in Cr and BUN still WNL. Baseline weight reported at 270, she is 271 lbs today - medical therapy with lisinopril 528m Soft bp's at times, start Toprol 12.31m48maily as opposed to coreg  - change diuretics to oral lasix 30m34md - ask nursing to ambulate Meredith Shah and document symptoms. If dose well would be ok for discharge today, we will arrange f/u in 1-2 weeks.    2. Obesity - with obesity and CHF will need consideration for outpatient sleep study  3. Chest pain - flat very  mild troponins, cath with normal coronaries.    Essential hypertension - Placed on above medication, please review.   Procedures:  AS above  Consultations:  Cardiology  Discharge Exam: Vitals:   07/10/17 1000 07/10/17 1208  BP: 100/60 103/62  Pulse:  82  Resp:  18  Temp:  98.5 F (36.9 C)  SpO2:  97%    General: Pt in nad, alert and awake Cardiovascular: rrr, no rubs Respiratory: no increased wob, no wheezes  Discharge Instructions   Discharge Instructions    Call MD for:  difficulty breathing, headache or visual disturbances    Complete by:  As directed    Call MD for:  extreme fatigue    Complete by:  As directed    Call MD for:  severe uncontrolled pain    Complete by:  As directed    Call MD for:  temperature >100.4    Complete by:  As directed    Diet - low sodium heart healthy    Complete by:  As directed    Discharge instructions    Complete by:  As directed    Please follow-up with your cardiologist in 2-3 weeks or sooner should any new concerns arise.   Increase activity slowly    Complete by:  As directed      Current Discharge Medication List    START taking these medications   Details  aspirin EC 81 MG EC tablet Take 1 tablet (81 mg total) by mouth daily. Qty: 30 tablet, Refills: 0    furosemide (  LASIX) 40 MG tablet Take 1 tablet (40 mg total) by mouth 2 (two) times daily. Qty: 60 tablet, Refills: 0    lisinopril (PRINIVIL,ZESTRIL) 5 MG tablet Take 1 tablet (5 mg total) by mouth daily. Qty: 30 tablet, Refills: 0    metoprolol succinate (TOPROL-XL) 25 MG 24 hr tablet Take 0.5 tablets (12.5 mg total) by mouth daily. Qty: 30 tablet, Refills: 0      CONTINUE these medications which have NOT CHANGED   Details  Calcium Carbonate Antacid (ALKA-SELTZER ANTACID PO) Take 1 tablet by mouth as needed.    SUPREP BOWEL PREP SOLN Take 1 kit by mouth once. Brand Name Only.  No Substitutions.  Suprep Bowel Kit. Qty: 177 mL, Refills: 0   Associated  Diagnoses: Special screening for malignant neoplasms, colon      STOP taking these medications     ibuprofen (ADVIL,MOTRIN) 100 MG/5ML suspension      doxycycline (VIBRAMYCIN) 100 MG capsule        No Known Allergies Follow-up Information    [name], [name], MD Follow up.   Specialty:  Cardiology Why:  The office will contact you to arrange Cardiology follow-up. If you do not hear from them within 3 business days, please call the number provided.  Contact information: [address] [place] [address] [phone]            The results of significant diagnostics from this hospitalization (including imaging, microbiology, ancillary and laboratory) are listed below for reference.    Significant Diagnostic Studies: Dg Chest 2 View  Result Date: 07/06/2017 CLINICAL DATA:  Shortness of breath for few days. EXAM: CHEST  2 VIEW COMPARISON:  March 1st 2017 FINDINGS: The heart size is mildly enlarged. Mediastinal contour is normal. There is mild bilateral diffuse increased pulmonary interstitium bilaterally. There is no focal pneumonia or or pleural effusion. Degenerative joint changes of the spine are noted. IMPRESSION: Mild congestive heart failure. Electronically Signed   By: Wei-Chen  Lin M.D.   On: 07/06/2017 13:31    Microbiology: No results found for this or any previous visit (from the past 240 hour(s)).   Labs: Basic Metabolic Panel:  Recent Labs Lab 07/06/17 2232 07/07/17 0244 07/08/17 0705 07/09/17 0528 07/10/17 0505  NA 139  --  139 139 138  K 3.5  --  3.4* 3.7 3.6  CL 108  --  101 102 103  CO2 23  --  29 28 26  GLUCOSE 106*  --  95 102* 101*  BUN 12  --  13 9 14  CREATININE 0.69 0.98 0.85 0.75 0.92  CALCIUM 8.8*  --  8.8* 8.5* 8.8*  MG  --  1.7  --   --   --    Liver Function Tests:  Recent Labs Lab 07/06/17 2232  AST 25  ALT 24  ALKPHOS 71  BILITOT 1.4*  PROT 7.0  ALBUMIN 3.4*   No results for input(s): LIPASE, AMYLASE in  the last 168 hours. No results for input(s): AMMONIA in the last 168 hours. CBC:  Recent Labs Lab 07/06/17 2232 07/07/17 0244 07/07/17 0633 07/10/17 0505  WBC 8.0 9.0 8.3 8.3  NEUTROABS 5.5  --   --   --   HGB 13.6 13.7 13.4 14.7  HCT 40.6 40.9 40.8 44.3  MCV 86.0 86.5 87.6 87.5  PLT 188 192 190 183   Cardiac Enzymes:  Recent Labs Lab 07/06/17 2232 07/07/17 0244 07/07/17 0633 07/07/17 1208  TROPONINI 0.05* 0.03* <  0.03 <0.03   BNP: BNP (last 3 results)  Recent Labs  07/06/17 2232  BNP 191.4*    ProBNP (last 3 results) No results for input(s): PROBNP in the last 8760 hours.  CBG: No results for input(s): GLUCAP in the last 168 hours.     Signed:  VEGA, ORLANDO MD.  [facility] 07/10/2017, 2:27 PM    

## 2017-07-10 NOTE — Progress Notes (Signed)
Progress Note  Patient Name: Meredith Shah Date of Encounter: 07/10/2017   Subjective   Denies any SOB  Inpatient Medications    Scheduled Meds: . aspirin EC  81 mg Oral Daily  . enoxaparin (LOVENOX) injection  40 mg Subcutaneous Q24H  . folic acid  1 mg Oral Daily  . furosemide  40 mg Intravenous Q12H  . lisinopril  5 mg Oral Daily  . LORazepam  0-4 mg Oral Q12H  . multivitamin with minerals  1 tablet Oral Daily  . sodium chloride flush  3 mL Intravenous Q12H  . thiamine  100 mg Oral Daily   Or  . thiamine  100 mg Intravenous Daily   Continuous Infusions: . sodium chloride     PRN Meds: sodium chloride, [DISCONTINUED] acetaminophen **OR** acetaminophen, acetaminophen, ondansetron **OR** ondansetron (ZOFRAN) IV, oxyCODONE, sodium chloride flush   Vital Signs    Vitals:   07/09/17 2205 07/09/17 2216 07/09/17 2256 07/10/17 0420  BP: 98/61 108/61 125/69 107/67  Pulse:    81  Resp:    20  Temp:    98.4 F (36.9 C)  TempSrc:    Oral  SpO2:    96%  Weight:    271 lb 11.2 oz (123.2 kg)  Height:        Intake/Output Summary (Last 24 hours) at 07/10/17 1041 Last data filed at 07/10/17 0938  Gross per 24 hour  Intake           853.75 ml  Output             3100 ml  Net         -2246.25 ml   Filed Weights   07/08/17 0618 07/09/17 0444 07/10/17 0420  Weight: 274 lb (124.3 kg) 274 lb 14.4 oz (124.7 kg) 271 lb 11.2 oz (123.2 kg)    Telemetry    SR - Personally Reviewed  ECG    n/a  Physical Exam   GEN: No acute distress.   Neck: No JVD Cardiac: RRR, no murmurs, rubs, or gallops.  Respiratory: Clear to auscultation bilaterally. GI: Soft, nontender, non-distended  MS: No edema; No deformity. Neuro:  Nonfocal  Psych: Normal affect   Labs    Chemistry Recent Labs Lab 07/06/17 2232  07/08/17 0705 07/09/17 0528 07/10/17 0505  NA 139  --  139 139 138  K 3.5  --  3.4* 3.7 3.6  CL 108  --  101 102 103  CO2 23  --  29 28 26   GLUCOSE 106*  --  95  102* 101*  BUN 12  --  13 9 14   CREATININE 0.69  < > 0.85 0.75 0.92  CALCIUM 8.8*  --  8.8* 8.5* 8.8*  PROT 7.0  --   --   --   --   ALBUMIN 3.4*  --   --   --   --   AST 25  --   --   --   --   ALT 24  --   --   --   --   ALKPHOS 71  --   --   --   --   BILITOT 1.4*  --   --   --   --   GFRNONAA >60  < > >60 >60 >60  GFRAA >60  < > >60 >60 >60  ANIONGAP 8  --  9 9 9   < > = values in this interval not displayed.   Hematology Recent  Labs Lab 07/07/17 0244 07/07/17 0633 07/10/17 0505  WBC 9.0 8.3 8.3  RBC 4.73 4.66 5.06  HGB 13.7 13.4 14.7  HCT 40.9 40.8 44.3  MCV 86.5 87.6 87.5  MCH 29.0 28.8 29.1  MCHC 33.5 32.8 33.2  RDW 13.5 13.9 13.6  PLT 192 190 183    Cardiac Enzymes Recent Labs Lab 07/06/17 2232 07/07/17 0244 07/07/17 0633 07/07/17 1208  TROPONINI 0.05* 0.03* <0.03 <0.03   No results for input(s): TROPIPOC in the last 168 hours.   BNP Recent Labs Lab 07/06/17 2232  BNP 191.4*     DDimer  Recent Labs Lab 07/07/17 0244  DDIMER 0.40     Radiology    No results found.  Cardiac Studies    Patient Profile     61 y.o. female admitted with acute systolic HF. Echo LVEF 40-45%, cath with normal coronaries, normal CI, and mildly elevated filling pressures.   Assessment & Plan    1. Acute systolic HF - 06/2017 echo LVEF 40-45% - 06/2017 LHC/RHC: normal coronaries. CI 3.3, PCWP 15, LVEDP 22 - negative 2.1 liters yesterday, negative 6.4 liters since admission. He is IV lasix  bid, slight uptrend in Cr and BUN still WNL. Baseline weight reported at 270, she is 271 lbs today - medical therapy with lisinopril . Soft bp's at times, start Toprol 12.5mg  daily as opposed to coreg   - change diuretics to oral lasix  bid - ask nursing to ambulate patient and document symptoms. If dose well would be ok for discharge today, we will arrange f/u in 1-2 weeks.    2. Obesity - with obesity and CHF will need consideration for outpatient sleep  study  3. Chest pain - flat very mild troponins, cath with normal coronaries.   For questions or updates, please contact [facility] Please consult [email or web address] for contact info under Cardiology/STEMI.      Joanie Coddington, MD  07/10/2017, 10:41 AM

## 2017-07-10 NOTE — Progress Notes (Signed)
Pt ambulated in a hallway without any distress. Her pulse oximetry is 95% in RA.

## 2017-07-10 NOTE — Progress Notes (Signed)
Pt got discharged to home, discharge instructions provided and patient showed understanding to it, IV taken out,Telemonitor DC,pt left unit in wheelchair with all of the belongings accompanied with a family member (Significant other)

## 2017-07-12 MED FILL — Lidocaine HCl Local Inj 2%: INTRAMUSCULAR | Qty: 10 | Status: AC

## 2017-07-13 ENCOUNTER — Telehealth: Payer: Self-pay | Admitting: Interventional Cardiology

## 2017-07-13 NOTE — Telephone Encounter (Signed)
New message    TOC appt made per staff message with Norma Fredrickson on 10/10 at 2pm.

## 2017-07-13 NOTE — Telephone Encounter (Signed)
Patient contacted regarding discharge from [facility] on 07/10/17.  Patient understands to follow up with provider Norma Fredrickson, NP on 07/21/17 at 2:00 at [address] [facility]. [address] in [place]. Patient understands discharge instructions? Yes Patient understands medications and regiment? Yes Patient understands to bring all medications to this visit? Yes  The pt has no complaints at this time. She does have [facility]'s phone number to call if she has any questions or concerns.

## 2017-07-21 ENCOUNTER — Ambulatory Visit: Payer: BLUE CROSS/BLUE SHIELD | Admitting: Nurse Practitioner

## 2017-08-11 ENCOUNTER — Telehealth: Payer: Self-pay | Admitting: *Deleted

## 2017-08-11 ENCOUNTER — Encounter: Payer: Self-pay | Admitting: Nurse Practitioner

## 2017-08-11 ENCOUNTER — Ambulatory Visit (INDEPENDENT_AMBULATORY_CARE_PROVIDER_SITE_OTHER): Payer: BLUE CROSS/BLUE SHIELD | Admitting: Nurse Practitioner

## 2017-08-11 VITALS — BP 126/80 | HR 82 | Ht 65.5 in | Wt 277.1 lb

## 2017-08-11 DIAGNOSIS — Z9889 Other specified postprocedural states: Secondary | ICD-10-CM

## 2017-08-11 DIAGNOSIS — I10 Essential (primary) hypertension: Secondary | ICD-10-CM

## 2017-08-11 DIAGNOSIS — I428 Other cardiomyopathies: Secondary | ICD-10-CM

## 2017-08-11 DIAGNOSIS — I5022 Chronic systolic (congestive) heart failure: Secondary | ICD-10-CM

## 2017-08-11 LAB — BASIC METABOLIC PANEL
BUN/Creatinine Ratio: 19 (ref 9–23)
BUN: 14 mg/dL (ref 6–24)
CO2: 26 mmol/L (ref 20–29)
Calcium: 9.3 mg/dL (ref 8.7–10.2)
Chloride: 98 mmol/L (ref 96–106)
Creatinine, Ser: 0.75 mg/dL (ref 0.57–1.00)
GFR calc Af Amer: 101 mL/min/{1.73_m2} (ref >59)
GFR calc non Af Amer: 88 mL/min/{1.73_m2} (ref >59)
Glucose: 107 mg/dL — ABNORMAL HIGH (ref 65–99)
Potassium: 4.2 mmol/L (ref 3.5–5.2)
Sodium: 141 mmol/L (ref 134–144)

## 2017-08-11 MED ORDER — FUROSEMIDE 40 MG PO TABS: 40.0000 mg | ORAL_TABLET | Freq: Two times a day (BID) | ORAL | 6 refills | Status: DC

## 2017-08-11 MED ORDER — LISINOPRIL 5 MG PO TABS
5.0000 mg | ORAL_TABLET | Freq: Every day | ORAL | 6 refills | Status: DC
Start: 2017-08-11 — End: 2017-11-09

## 2017-08-11 MED ORDER — METOPROLOL SUCCINATE ER 25 MG PO TB24: 12.5000 mg | ORAL_TABLET | Freq: Every day | ORAL | 0 refills | Status: DC

## 2017-08-11 MED ORDER — FUROSEMIDE 40 MG PO TABS: 40.0000 mg | ORAL_TABLET | Freq: Two times a day (BID) | ORAL | 0 refills | Status: DC

## 2017-08-11 MED ORDER — LISINOPRIL 5 MG PO TABS: 5.0000 mg | ORAL_TABLET | Freq: Every day | ORAL | 0 refills | Status: DC

## 2017-08-11 MED ORDER — METOPROLOL SUCCINATE ER 25 MG PO TB24: 25.0000 mg | ORAL_TABLET | Freq: Every day | ORAL | 6 refills | Status: DC

## 2017-08-11 NOTE — Telephone Encounter (Signed)
-----   Message from Rosalio Macadamia, NP sent at 08/11/2017  8:26 AM EDT ----- Needs sleep study arranged.   lori

## 2017-08-11 NOTE — Progress Notes (Signed)
CARDIOLOGY OFFICE NOTE  Date:  08/11/2017    [name] Date of Birth: [birth year] Medical Record #[id number]  PCP:  Patient, No Pcp Per  Cardiologist:  Katrinka Blazing  Chief Complaint  Patient presents with  . Congestive Heart Failure    Post hospital visit - seen for Dr. Katrinka Blazing    History of Present Illness: Meredith Shah is a 61 y.o. female who presents today for a post hospital visit. Seen for Dr. Katrinka Blazing (NEW).  She has a history of obesity, probable OSA who presented last month with shortness of breath. Found to have acute systolic HF - EF is 40 to 45%. She underwent an ischemic work up with normal coronaries noted. Started on ACE. BP soft - Toprol used in place of Coreg. Discharged on oral diuretics.   Comes in today. Here alone. Doing ok. She notes she still has some shortness of breath. No swelling. She does not weigh daily. She does not really restrict her salt. She drinks 3 to 4 (perhaps more) of bourbon on a daily basis. Sleep study not arranged to her knowledge. She is taking her medicines - needs refills. Weight already going back up.     Past Medical History:  Diagnosis Date  . Arthritis    knee  . Chest pain 07/07/2017  . CHF (congestive heart failure) ([facility]) 07/07/2017  . Hyperlipidemia    diet controlled, no meds  . Hypertension   . Morbid obesity ([facility]) 07/07/2017  . SVD (spontaneous vaginal delivery)    x 4    Past Surgical History:  Procedure Laterality Date  . ABDOMINAL HYSTERECTOMY    . BREAST SURGERY     reduction  . btl    . KNEE SURGERY Right   . RIGHT/LEFT HEART CATH AND CORONARY ANGIOGRAPHY N/A 07/09/2017   Procedure: RIGHT/LEFT HEART CATH AND CORONARY ANGIOGRAPHY;  Surgeon: Lyn Records, MD;  Location: [facility] INVASIVE CV LAB;  Service: Cardiovascular;  Laterality: N/A;     Medications: Current Meds  Medication Sig  . aspirin EC 81 MG EC tablet Take 1 tablet (81 mg total) by mouth daily.  . Calcium Carbonate Antacid (ALKA-SELTZER ANTACID PO)  Take 1 tablet by mouth as needed.  . furosemide (LASIX) 40 MG tablet Take 1 tablet (40 mg total) by mouth 2 (two) times daily.  Marland Kitchen lisinopril (PRINIVIL,ZESTRIL) 5 MG tablet Take 1 tablet (5 mg total) by mouth daily.  . metoprolol succinate (TOPROL-XL) 25 MG 24 hr tablet Take 1 tablet (25 mg total) by mouth daily.  . [DISCONTINUED] furosemide (LASIX) 40 MG tablet Take 1 tablet (40 mg total) by mouth 2 (two) times daily.  . [DISCONTINUED] furosemide (LASIX) 40 MG tablet Take 1 tablet (40 mg total) by mouth 2 (two) times daily.  . [DISCONTINUED] lisinopril (PRINIVIL,ZESTRIL) 5 MG tablet Take 1 tablet (5 mg total) by mouth daily.  . [DISCONTINUED] lisinopril (PRINIVIL,ZESTRIL) 5 MG tablet Take 1 tablet (5 mg total) by mouth daily.  . [DISCONTINUED] metoprolol succinate (TOPROL-XL) 25 MG 24 hr tablet Take 0.5 tablets (12.5 mg total) by mouth daily.  . [DISCONTINUED] metoprolol succinate (TOPROL-XL) 25 MG 24 hr tablet Take 0.5 tablets (12.5 mg total) by mouth daily.     Allergies: No Known Allergies  Social History: The patient  reports that she has never smoked. She has never used smokeless tobacco. She reports that she drinks about 4.2 oz of alcohol per week . She reports that she does not use drugs.   Family  History: The patient's family history includes Throat cancer in her father.   Review of Systems: Please see the history of present illness.   Otherwise, the review of systems is positive for none.   All other systems are reviewed and negative.   Physical Exam: VS:  BP 126/80   Pulse 82   Ht 5' 5.5" (1.664 m)   Wt 277 lb 1.9 oz (125.7 kg)   SpO2 98%   BMI 45.41 kg/m  .  BMI Body mass index is 45.41 kg/m.  Wt Readings from Last 3 Encounters:  08/11/17 277 lb 1.9 oz (125.7 kg)  07/10/17 271 lb 11.2 oz (123.2 kg)  01/17/16 275 lb 9.6 oz (125 kg)    General: Pleasant. Well developed, well nourished and in no acute distress.   HEENT: Normal.  Neck: Supple, no JVD, carotid bruits,  or masses noted.  Cardiac: Regular rate and rhythm. No murmurs, rubs, or gallops. No edema.  Respiratory:  Lungs are clear to auscultation bilaterally with normal work of breathing.  GI: Soft and nontender.  MS: No deformity or atrophy. Gait and ROM intact.  Skin: Warm and dry. Color is normal.  Neuro:  Strength and sensation are intact and no gross focal deficits noted.  Psych: Alert, appropriate and with normal affect.   LABORATORY DATA:  EKG:  EKG is not ordered today.  Lab Results  Component Value Date   WBC 8.3 07/10/2017   HGB 14.7 07/10/2017   HCT 44.3 07/10/2017   PLT 183 07/10/2017   GLUCOSE 101 (H) 07/10/2017   CHOL 198 07/09/2017   TRIG 112 07/09/2017   HDL 41 07/09/2017   LDLCALC 135 (H) 07/09/2017   ALT 24 07/06/2017   AST 25 07/06/2017   NA 138 07/10/2017   K 3.6 07/10/2017   CL 103 07/10/2017   CREATININE 0.92 07/10/2017   BUN 14 07/10/2017   CO2 26 07/10/2017   TSH 1.963 07/07/2017   INR 1.04 07/09/2017     BNP (last 3 results)  Recent Labs  07/06/17 2232  BNP 191.4*    ProBNP (last 3 results) No results for input(s): PROBNP in the last 8760 hours.   Other Studies Reviewed Today:  RIGHT/LEFT HEART CATH AND CORONARY ANGIOGRAPHY 06/2017  Conclusion    Normal coronary arteries  Dilated, severely hypo-contractile left ventricle with estimated ejection fraction 20-25%. LVEDP mildly elevated at 24 mmHg.  presentation compatible with acute on chronic systolic heart failure.  Mild pulmonary hypertension. Pulmonary capillary wedge pressure mean 14 mmHg  RECOMMENDATIONS:   Guideline directed medical therapy for HFrEF.  Abstinence from alcohol.  Blood pressure management.  Needs outpatient sleep study.   Echo Study Conclusions 06/2017  - Left ventricle: The cavity size was mildly dilated. Wall   thickness was normal. Systolic function was mildly to moderately   reduced. The estimated ejection fraction was in the range of 40%   to  45%. Diffuse hypokinesis. Left ventricular diastolic function   parameters were normal. - Left atrium: The atrium was mildly dilated. - Atrial septum: No defect or patent foramen ovale was identified.   Assessment/Plan:  1. Chronic systolic HF - EF of 20 to 25% by cath - 40 to 45% by echo - I suspect her alcohol is the culprit - she seems to have very poor insight into the severity of this problem - discussed at length and advised total alcohol cessation, salt restriction, daily weights, etc. Will increase Toprol to 25 mg a day. Hope to increase  her ACE on return. Will need repeat echo in about 3 months after being on target therapy. BMET today.   2. Obesity  3. Probable OSA - arranging sleep study.   4. S/P cardiac cath - normal coronaries noted  5. Alcohol use - I think this is her main issue - discussed at length - she drinks daily but says she has stopped in the past.   Current medicines are reviewed with the patient today.  The patient does not have concerns regarding medicines other than what has been noted above.  The following changes have been made:  See above.  Labs/ tests ordered today include:    Orders Placed This Encounter  Procedures  . Basic metabolic panel     Disposition:   FU with me in 4 to 6 weeks.    Patient is agreeable to this plan and will call if any problems develop in the interim.   SignedNorma Fredrickson: Meredith Hannula, Meredith Shah  08/11/2017 8:26 AM  [facility] [address] [place], [place]  [address] Phone: [phone] Fax: [phone]

## 2017-08-11 NOTE — Patient Instructions (Addendum)
We will be checking the following labs today - BMET   Medication Instructions:    Continue with your current medicines. BUT  I am increasing the Toprol to a whole tablet each day  I sent in your refills today    Testing/Procedures To Be Arranged:  N/A  Follow-Up:   See me in about 4 to 6 weeks    Other Special Instructions:   Will get sleep study arranged  Stop alcohol  Restrict your salt  Try to weigh daily    If you need a refill on your cardiac medications before your next appointment, please call your pharmacy.   Call the [facility] office at [phone] if you have any questions, problems or concerns.

## 2017-08-16 ENCOUNTER — Telehealth: Payer: Self-pay | Admitting: Nurse Practitioner

## 2017-08-16 NOTE — Telephone Encounter (Signed)
Called pt and left message for pt to call back for lab results.  °

## 2017-08-19 ENCOUNTER — Encounter: Payer: Self-pay | Admitting: *Deleted

## 2017-08-20 NOTE — Telephone Encounter (Signed)
Informed patient of upcoming sleep study and patient understanding was verbalized. Patient understands her sleep study is scheduled for Friday September 24 2017. Patient understands her sleep study will be done at [facility] sleep lab. Patient understands she will receive a sleep packet in a week or so. Patient understands to call if she does not receive the sleep packet in a timely manner. Patient agrees with treatment and thanked me for call.

## 2017-09-13 ENCOUNTER — Ambulatory Visit: Payer: BLUE CROSS/BLUE SHIELD | Admitting: Nurse Practitioner

## 2017-09-13 NOTE — Progress Notes (Deleted)
CARDIOLOGY OFFICE NOTE  Date:  09/13/2017    Meredith Shah Date of Birth: 1956-07-07 Medical Record #[id number]  PCP:  Patient, No Pcp Per  Cardiologist:  Tyrone SageGerhardt & Katrinka BlazingSmith    No chief complaint on file.   History of Present Illness: Meredith Shah is a 61 y.o. female who presents today for a 6 week check. Seen for Dr. Katrinka BlazingSmith.  She has a history of obesity, probable OSA who presented in September with shortness of breath. Found to have acute systolic HF - EF is 40 to 45%. She underwent an ischemic work up with normal coronaries noted. Started on ACE. BP soft - Toprol used in place of Coreg. Discharged on oral diuretics.   I then saw her at the end of October - was doing ok but not really following CHF regimen and seemed to have poor insight into her issue. Has daily alcohol use. Needed sleep study arranged.   Comes in today. Here alone.    Past Medical History:  Diagnosis Date  . Arthritis    knee  . Chest pain 07/07/2017  . CHF (congestive heart failure) (HCC) 07/07/2017  . Hyperlipidemia    diet controlled, no meds  . Hypertension   . Morbid obesity (HCC) 07/07/2017  . SVD (spontaneous vaginal delivery)    x 4    Past Surgical History:  Procedure Laterality Date  . ABDOMINAL HYSTERECTOMY    . BREAST SURGERY     reduction  . btl    . KNEE SURGERY Right   . RIGHT/LEFT HEART CATH AND CORONARY ANGIOGRAPHY N/A 07/09/2017   Procedure: RIGHT/LEFT HEART CATH AND CORONARY ANGIOGRAPHY;  Surgeon: Lyn RecordsSmith, [name], MD;  Location: [facility];  Service: Cardiovascular;  Laterality: N/A;     Medications: No outpatient medications have been marked as taking for the 09/13/17 encounter (Appointment) with Rosalio MacadamiaGerhardt, Norval Slaven C, NP.     Allergies: No Known Allergies  Social History: The patient  reports that  has never smoked. she has never used smokeless tobacco. She reports that she drinks about 4.2 oz of alcohol per week. She reports that she does not use drugs.     Family History: The patient's family history includes Throat cancer in her father.   Review of Systems: Please see the history of present illness.   Otherwise, the review of systems is positive for none.   All other systems are reviewed and negative.   Physical Exam: VS:  There were no vitals taken for this visit. Marland Kitchen.  BMI There is no height or weight on file to calculate BMI.  Wt Readings from Last 3 Encounters:  08/11/17 277 lb 1.9 oz (125.7 kg)  07/10/17 271 lb 11.2 oz (123.2 kg)  01/17/16 275 lb 9.6 oz (125 kg)    General: Pleasant. Well developed, well nourished and in no acute distress.   HEENT: Normal.  Neck: Supple, no JVD, carotid bruits, or masses noted.  Cardiac: Regular rate and rhythm. No murmurs, rubs, or gallops. No edema.  Respiratory:  Lungs are clear to auscultation bilaterally with normal work of breathing.  GI: Soft and nontender.  MS: No deformity or atrophy. Gait and ROM intact.  Skin: Warm and dry. Color is normal.  Neuro:  Strength and sensation are intact and no gross focal deficits noted.  Psych: Alert, appropriate and with normal affect.   LABORATORY DATA:  EKG:  EKG is not ordered today.  Lab Results  Component Value Date  WBC 8.3 07/10/2017   HGB 14.7 07/10/2017   HCT 44.3 07/10/2017   PLT 183 07/10/2017   GLUCOSE 107 (H) 08/11/2017   CHOL 198 07/09/2017   TRIG 112 07/09/2017   HDL 41 07/09/2017   LDLCALC 135 (H) 07/09/2017   ALT 24 07/06/2017   AST 25 07/06/2017   NA 141 08/11/2017   K 4.2 08/11/2017   CL 98 08/11/2017   CREATININE 0.75 08/11/2017   BUN 14 08/11/2017   CO2 26 08/11/2017   TSH 1.963 07/07/2017   INR 1.04 07/09/2017     BNP (last 3 results) Recent Labs    07/06/17 2232  BNP 191.4*    ProBNP (last 3 results) No results for input(s): PROBNP in the last 8760 hours.   Other Studies Reviewed Today:  RIGHT/LEFT HEART CATH AND CORONARY ANGIOGRAPHY 06/2017  Conclusion    Normal coronary arteries  Dilated,  severely hypo-contractile left ventricle with estimated ejection fraction 20-25%. LVEDP mildly elevated at 24 mmHg.  presentation compatible with acute on chronic systolic heart failure.  Mild pulmonary hypertension. Pulmonary capillary wedge pressure mean 14 mmHg  RECOMMENDATIONS:   Guideline directed medical therapy for HFrEF.  Abstinence from alcohol.  Blood pressure management.  Needs outpatient sleep study.   Echo Study Conclusions 06/2017  - Left ventricle: The cavity size was mildly dilated. Wall thickness was normal. Systolic function was mildly to moderately reduced. The estimated ejection fraction was in the range of 40% to 45%. Diffuse hypokinesis. Left ventricular diastolic function parameters were normal. - Left atrium: The atrium was mildly dilated. - Atrial septum: No defect or patent foramen ovale was identified.   Assessment/Plan:  1. Chronic systolic HF - EF of 20 to 25% by cath - 40 to 45% by echo - I suspect her alcohol is the culprit - she seems to have very poor insight into the severity of this problem - discussed at length and advised total alcohol cessation, salt restriction, daily weights, etc. Will increase Toprol to 25 mg a day. Hope to increase her ACE on return. Will need repeat echo in about 3 months after being on target therapy. BMET today.   2. Obesity  3. Probable OSA - arranging sleep study.   4. S/P cardiac cath - normal coronaries noted  5. Alcohol use - I think this is her main issue - discussed at length - she drinks daily but says she has stopped in the past.    Current medicines are reviewed with the patient today.  The patient does not have concerns regarding medicines other than what has been noted above.  The following changes have been made:  See above.  Labs/ tests ordered today include:   No orders of the defined types were placed in this encounter.    Disposition:   FU with *** in {gen number  [id number]} {Days to years:10300}.   Patient is agreeable to this plan and will call if any problems develop in the interim.   SignedNorma Fredrickson, NP  09/13/2017 7:45 AM  [facility] [address] [place], [place]  [address] Phone: [phone] Fax: [phone]

## 2017-09-14 ENCOUNTER — Encounter: Payer: Self-pay | Admitting: Nurse Practitioner

## 2017-09-22 NOTE — Telephone Encounter (Signed)
Reached out to patient to inform her that her sleep study has been cancelled for Friday December 14. Left message to call back on her home phone/cell # [phone].

## 2017-09-23 NOTE — Telephone Encounter (Signed)
FW: In lab sleep study denied   Deidre Ala, CMA        Dr. Garald Braver switching to HST. I will submit precert for HST.  Thanks,  Amy   Previous Messages    ----- Message -----  From: Rosalio Macadamia, NP  Sent: 09/17/2017 11:09 AM  To: [name]  Subject: FW: In lab sleep study denied           Ok to switch to home study.   Thanks  lori  ----- Message -----  From: [name]  Sent: 09/17/2017  9:54 AM  To: Rosalio Macadamia, NP  Subject: In lab sleep study denied             Insurance denied in lab sleep study. Is it okay to switch to home study or would you like to appeal?  Thanks,  Amy

## 2017-09-24 ENCOUNTER — Encounter (HOSPITAL_BASED_OUTPATIENT_CLINIC_OR_DEPARTMENT_OTHER): Payer: BLUE CROSS/BLUE SHIELD

## 2017-10-08 ENCOUNTER — Telehealth: Payer: Self-pay | Admitting: *Deleted

## 2017-10-08 NOTE — Telephone Encounter (Addendum)
LMTCB on cell  FW: In lab sleep study denied  Deidre Ala, CMA        Okay to switch to home study.  Thanks,  Amy

## 2017-10-15 NOTE — Telephone Encounter (Signed)
LMTCB on cell...[phone].

## 2017-10-18 NOTE — Telephone Encounter (Signed)
Patient does not qualify for the in home sleep study through her insurance and would have to self pay per [employer].

## 2017-10-19 NOTE — Telephone Encounter (Signed)
Noted  

## 2017-10-29 ENCOUNTER — Telehealth: Payer: Self-pay | Admitting: *Deleted

## 2017-10-29 NOTE — Telephone Encounter (Signed)
-----   Message from [name] sent at 10/28/2017  3:04 PM EST ----- Regarding: Switch to home study Coralee [place],  Per Norma Fredrickson, okay to switch to home study. Thanks. ----- Message ----- From: [name] Sent: 09/29/2017   1:48 PM To: Reesa Chew, CMA Subject: FW: In lab sleep study denied                  Okay to switch to home study. Thanks, Amy ----- Message ----- From: Rosalio Macadamia, NP Sent: 09/17/2017  11:09 AM To: [name] Subject: FW: In lab sleep study denied                  Ok to switch to home study.  Thanks lori ----- Message ----- From: [name] Sent: 09/17/2017   9:54 AM To: Rosalio Macadamia, NP Subject: In lab sleep study denied                      Insurance denied in lab sleep study. Is it okay to switch to home study or would you like to appeal? Thanks, Amy

## 2017-11-05 ENCOUNTER — Other Ambulatory Visit: Payer: Self-pay

## 2017-11-05 ENCOUNTER — Emergency Department (HOSPITAL_COMMUNITY)
Admission: EM | Admit: 2017-11-05 | Discharge: 2017-11-05 | Discharge status: Against Medical Advice | Payer: BLUE CROSS/BLUE SHIELD | Attending: Emergency Medicine | Admitting: Emergency Medicine

## 2017-11-05 ENCOUNTER — Encounter (HOSPITAL_COMMUNITY): Payer: Self-pay | Admitting: Emergency Medicine

## 2017-11-05 ENCOUNTER — Emergency Department (HOSPITAL_COMMUNITY): Payer: BLUE CROSS/BLUE SHIELD

## 2017-11-05 DIAGNOSIS — R0602 Shortness of breath: Secondary | ICD-10-CM | POA: Insufficient documentation

## 2017-11-05 DIAGNOSIS — Z5321 Procedure and treatment not carried out due to patient leaving prior to being seen by health care provider: Secondary | ICD-10-CM | POA: Insufficient documentation

## 2017-11-05 LAB — CBC
HEMATOCRIT: 43.6 % (ref 36.0–46.0)
HEMOGLOBIN: 14.7 g/dL (ref 12.0–15.0)
MCH: 29.4 pg (ref 26.0–34.0)
MCHC: 33.7 g/dL (ref 30.0–36.0)
MCV: 87.2 fL (ref 78.0–100.0)
Platelets: 178 10*3/uL (ref 150–400)
RBC: 5 MIL/uL (ref 3.87–5.11)
RDW: 13.5 % (ref 11.5–15.5)
WBC: 7 10*3/uL (ref 4.0–10.5)

## 2017-11-05 LAB — BASIC METABOLIC PANEL
ANION GAP: 13 (ref 5–15)
BUN: 14 mg/dL (ref 6–20)
CO2: 21 mmol/L — ABNORMAL LOW (ref 22–32)
Calcium: 8.7 mg/dL — ABNORMAL LOW (ref 8.9–10.3)
Chloride: 105 mmol/L (ref 101–111)
Creatinine, Ser: 0.82 mg/dL (ref 0.44–1.00)
Glucose, Bld: 87 mg/dL (ref 65–99)
POTASSIUM: 3.8 mmol/L (ref 3.5–5.1)
Sodium: 139 mmol/L (ref 135–145)

## 2017-11-05 LAB — TROPONIN I: Troponin I: <0.03 ng/mL (ref <0.03)

## 2017-11-05 MED ORDER — ALBUTEROL SULFATE (2.5 MG/3ML) 0.083% IN NEBU: 5.0000 mg | INHALATION_SOLUTION | Freq: Once | RESPIRATORY_TRACT | Status: DC

## 2017-11-05 NOTE — ED Provider Notes (Signed)
Patient left after triage. I did not see or evaluate the patient   EKG Interpretation  Date/Time:  Friday November 05 2017 16:37:13 EST Ventricular Rate:  94 PR Interval:  152 QRS Duration: 100 QT Interval:  366 QTC Calculation: 457 R Axis:   12 Text Interpretation:  Sinus rhythm with occasional Premature ventricular complexes ST & T wave abnormality, consider lateral ischemia Abnormal ECG No old tracing to compare Confirmed by Azalia Bilis ([id number]) on 11/05/2017 11:52:48 PM          Azalia Bilis, MD 11/05/17 2353

## 2017-11-05 NOTE — ED Notes (Signed)
No answer for room, LWBS

## 2017-11-05 NOTE — ED Triage Notes (Signed)
Patient presents to the ED reporting that she is "Short of breath". She said that she ran out of all her meds and have since stopped taking them.

## 2017-11-07 ENCOUNTER — Encounter (HOSPITAL_COMMUNITY): Payer: Self-pay

## 2017-11-07 ENCOUNTER — Emergency Department (HOSPITAL_COMMUNITY): Payer: BLUE CROSS/BLUE SHIELD

## 2017-11-07 ENCOUNTER — Inpatient Hospital Stay (HOSPITAL_COMMUNITY)
Admission: EM | Admit: 2017-11-07 | Discharge: 2017-11-09 | DRG: 292 | Disposition: A | Discharge status: Home / Self Care | Payer: BLUE CROSS/BLUE SHIELD | Attending: Family Medicine | Admitting: Family Medicine

## 2017-11-07 ENCOUNTER — Other Ambulatory Visit: Payer: Self-pay

## 2017-11-07 DIAGNOSIS — T501X6A Underdosing of loop [high-ceiling] diuretics, initial encounter: Secondary | ICD-10-CM | POA: Diagnosis present

## 2017-11-07 DIAGNOSIS — Z7982 Long term (current) use of aspirin: Secondary | ICD-10-CM

## 2017-11-07 DIAGNOSIS — F1010 Alcohol abuse, uncomplicated: Secondary | ICD-10-CM | POA: Diagnosis present

## 2017-11-07 DIAGNOSIS — T447X6A Underdosing of beta-adrenoreceptor antagonists, initial encounter: Secondary | ICD-10-CM | POA: Diagnosis present

## 2017-11-07 DIAGNOSIS — I10 Essential (primary) hypertension: Secondary | ICD-10-CM | POA: Diagnosis present

## 2017-11-07 DIAGNOSIS — G4733 Obstructive sleep apnea (adult) (pediatric): Secondary | ICD-10-CM

## 2017-11-07 DIAGNOSIS — I5043 Acute on chronic combined systolic (congestive) and diastolic (congestive) heart failure: Secondary | ICD-10-CM | POA: Diagnosis present

## 2017-11-07 DIAGNOSIS — I110 Hypertensive heart disease with heart failure: Principal | ICD-10-CM | POA: Diagnosis present

## 2017-11-07 DIAGNOSIS — Z79899 Other long term (current) drug therapy: Secondary | ICD-10-CM | POA: Diagnosis not present

## 2017-11-07 DIAGNOSIS — I509 Heart failure, unspecified: Secondary | ICD-10-CM | POA: Diagnosis not present

## 2017-11-07 DIAGNOSIS — E785 Hyperlipidemia, unspecified: Secondary | ICD-10-CM | POA: Diagnosis present

## 2017-11-07 DIAGNOSIS — I248 Other forms of acute ischemic heart disease: Secondary | ICD-10-CM | POA: Diagnosis present

## 2017-11-07 DIAGNOSIS — Z9119 Patient's noncompliance with other medical treatment and regimen: Secondary | ICD-10-CM | POA: Diagnosis not present

## 2017-11-07 DIAGNOSIS — Z6841 Body Mass Index (BMI) 40.0 and over, adult: Secondary | ICD-10-CM | POA: Diagnosis not present

## 2017-11-07 DIAGNOSIS — J440 Chronic obstructive pulmonary disease with acute lower respiratory infection: Secondary | ICD-10-CM | POA: Diagnosis not present

## 2017-11-07 DIAGNOSIS — J209 Acute bronchitis, unspecified: Secondary | ICD-10-CM | POA: Diagnosis present

## 2017-11-07 DIAGNOSIS — I429 Cardiomyopathy, unspecified: Secondary | ICD-10-CM | POA: Diagnosis present

## 2017-11-07 DIAGNOSIS — Z91199 Patient's noncompliance with other medical treatment and regimen due to unspecified reason: Secondary | ICD-10-CM

## 2017-11-07 DIAGNOSIS — K219 Gastro-esophageal reflux disease without esophagitis: Secondary | ICD-10-CM | POA: Diagnosis present

## 2017-11-07 DIAGNOSIS — Z9111 Patient's noncompliance with dietary regimen: Secondary | ICD-10-CM | POA: Diagnosis not present

## 2017-11-07 DIAGNOSIS — I272 Pulmonary hypertension, unspecified: Secondary | ICD-10-CM | POA: Diagnosis present

## 2017-11-07 DIAGNOSIS — Z91128 Patient's intentional underdosing of medication regimen for other reason: Secondary | ICD-10-CM

## 2017-11-07 HISTORY — DX: Other cardiomyopathies: I42.8

## 2017-11-07 HISTORY — DX: Obstructive sleep apnea (adult) (pediatric): G47.33

## 2017-11-07 HISTORY — DX: Chronic combined systolic (congestive) and diastolic (congestive) heart failure: I50.42

## 2017-11-07 HISTORY — DX: Alcohol abuse, uncomplicated: F10.10

## 2017-11-07 LAB — I-STAT CHEM 8, ED
BUN: 6 mg/dL (ref 6–20)
CALCIUM ION: 1.05 mmol/L — AB (ref 1.15–1.40)
CREATININE: 0.7 mg/dL (ref 0.44–1.00)
Chloride: 103 mmol/L (ref 101–111)
GLUCOSE: 119 mg/dL — AB (ref 65–99)
HCT: 44 % (ref 36.0–46.0)
Hemoglobin: 15 g/dL (ref 12.0–15.0)
Potassium: 3.6 mmol/L (ref 3.5–5.1)
Sodium: 142 mmol/L (ref 135–145)
TCO2: 24 mmol/L (ref 22–32)

## 2017-11-07 LAB — CBC
HCT: 42.3 % (ref 36.0–46.0)
HCT: 43.3 % (ref 36.0–46.0)
HEMOGLOBIN: 14.9 g/dL (ref 12.0–15.0)
Hemoglobin: 14.4 g/dL (ref 12.0–15.0)
MCH: 29.4 pg (ref 26.0–34.0)
MCH: 30 pg (ref 26.0–34.0)
MCHC: 34 g/dL (ref 30.0–36.0)
MCHC: 34.4 g/dL (ref 30.0–36.0)
MCV: 86.3 fL (ref 78.0–100.0)
MCV: 87.3 fL (ref 78.0–100.0)
PLATELETS: 164 10*3/uL (ref 150–400)
Platelets: 149 10*3/uL — ABNORMAL LOW (ref 150–400)
RBC: 4.9 MIL/uL (ref 3.87–5.11)
RBC: 4.96 MIL/uL (ref 3.87–5.11)
RDW: 13.9 % (ref 11.5–15.5)
RDW: 13.9 % (ref 11.5–15.5)
WBC: 7.2 10*3/uL (ref 4.0–10.5)
WBC: 7.3 10*3/uL (ref 4.0–10.5)

## 2017-11-07 LAB — HEPATIC FUNCTION PANEL
ALK PHOS: 77 U/L (ref 38–126)
ALT: 26 U/L (ref 14–54)
AST: 38 U/L (ref 15–41)
Albumin: 3.7 g/dL (ref 3.5–5.0)
Total Bilirubin: 0.6 mg/dL (ref 0.3–1.2)
Total Protein: 7.6 g/dL (ref 6.5–8.1)

## 2017-11-07 LAB — I-STAT TROPONIN, ED: Troponin i, poc: 0.03 ng/mL (ref 0.00–0.08)

## 2017-11-07 LAB — BASIC METABOLIC PANEL
Anion gap: 11 (ref 5–15)
BUN: 8 mg/dL (ref 6–20)
CO2: 23 mmol/L (ref 22–32)
CREATININE: 0.74 mg/dL (ref 0.44–1.00)
Calcium: 8.5 mg/dL — ABNORMAL LOW (ref 8.9–10.3)
Chloride: 104 mmol/L (ref 101–111)
GFR calc Af Amer: >60 mL/min (ref >60)
Glucose, Bld: 118 mg/dL — ABNORMAL HIGH (ref 65–99)
POTASSIUM: 3.7 mmol/L (ref 3.5–5.1)
SODIUM: 138 mmol/L (ref 135–145)

## 2017-11-07 LAB — MAGNESIUM: Magnesium: 1.5 mg/dL — ABNORMAL LOW (ref 1.7–2.4)

## 2017-11-07 LAB — CREATININE, SERUM
CREATININE: 0.8 mg/dL (ref 0.44–1.00)
GFR calc Af Amer: >60 mL/min (ref >60)
GFR calc non Af Amer: >60 mL/min (ref >60)

## 2017-11-07 LAB — TROPONIN I
TROPONIN I: 0.04 ng/mL — AB (ref <0.03)
TROPONIN I: 0.04 ng/mL — AB (ref <0.03)
TROPONIN I: 0.05 ng/mL — AB (ref <0.03)

## 2017-11-07 LAB — BRAIN NATRIURETIC PEPTIDE: B Natriuretic Peptide: 89.1 pg/mL (ref 0.0–100.0)

## 2017-11-07 LAB — I-STAT BETA HCG BLOOD, ED (MC, WL, AP ONLY)

## 2017-11-07 LAB — TSH: TSH: 1.24 u[IU]/mL (ref 0.350–4.500)

## 2017-11-07 MED ORDER — SODIUM CHLORIDE 0.9% FLUSH
3.0000 mL | Freq: Two times a day (BID) | INTRAVENOUS | Status: DC
  Administered 2017-11-07 – 2017-11-09 (×5): 3 mL via INTRAVENOUS

## 2017-11-07 MED ORDER — SODIUM CHLORIDE 0.9 % IV SOLN
INTRAVENOUS | Status: DC
  Administered 2017-11-07: 07:00:00 via INTRAVENOUS

## 2017-11-07 MED ORDER — MAGNESIUM OXIDE 400 (241.3 MG) MG PO TABS: 400.0000 mg | ORAL_TABLET | Freq: Two times a day (BID) | ORAL | Status: DC

## 2017-11-07 MED ORDER — ACETAMINOPHEN 325 MG PO TABS
650.0000 mg | ORAL_TABLET | Freq: Four times a day (QID) | ORAL | Status: DC | PRN
  Administered 2017-11-09: 650 mg via ORAL
  Filled 2017-11-07: qty 2

## 2017-11-07 MED ORDER — HYDRALAZINE HCL 20 MG/ML IJ SOLN: 10.0000 mg | Freq: Three times a day (TID) | INTRAMUSCULAR | Status: DC | PRN

## 2017-11-07 MED ORDER — ENOXAPARIN SODIUM 60 MG/0.6ML ~~LOC~~ SOLN
60.0000 mg | SUBCUTANEOUS | Status: DC
  Administered 2017-11-07 – 2017-11-08 (×2): 60 mg via SUBCUTANEOUS
  Filled 2017-11-07 (×3): qty 0.6

## 2017-11-07 MED ORDER — PANTOPRAZOLE SODIUM 40 MG PO TBEC
40.0000 mg | DELAYED_RELEASE_TABLET | Freq: Every day | ORAL | Status: DC
  Administered 2017-11-07 – 2017-11-09 (×3): 40 mg via ORAL
  Filled 2017-11-07 (×3): qty 1

## 2017-11-07 MED ORDER — POTASSIUM CHLORIDE CRYS ER 20 MEQ PO TBCR
20.0000 meq | EXTENDED_RELEASE_TABLET | Freq: Every day | ORAL | Status: DC
  Administered 2017-11-07 – 2017-11-09 (×3): 20 meq via ORAL
  Filled 2017-11-07 (×3): qty 1

## 2017-11-07 MED ORDER — FUROSEMIDE 10 MG/ML IJ SOLN
40.0000 mg | Freq: Two times a day (BID) | INTRAMUSCULAR | Status: DC
  Administered 2017-11-07 – 2017-11-08 (×2): 40 mg via INTRAVENOUS
  Filled 2017-11-07 (×2): qty 4

## 2017-11-07 MED ORDER — ACETAMINOPHEN 325 MG PO TABS: 650.0000 mg | ORAL_TABLET | ORAL | Status: DC | PRN

## 2017-11-07 MED ORDER — LISINOPRIL 5 MG PO TABS
5.0000 mg | ORAL_TABLET | Freq: Every day | ORAL | Status: DC
  Administered 2017-11-07 – 2017-11-09 (×3): 5 mg via ORAL
  Filled 2017-11-07 (×3): qty 1

## 2017-11-07 MED ORDER — SODIUM CHLORIDE 0.9 % IV SOLN
250.0000 mL | INTRAVENOUS | Status: DC | PRN
Start: 2017-11-07 — End: 2017-11-09

## 2017-11-07 MED ORDER — ONDANSETRON HCL 4 MG/2ML IJ SOLN: 4.0000 mg | Freq: Four times a day (QID) | INTRAMUSCULAR | Status: DC | PRN

## 2017-11-07 MED ORDER — METOPROLOL SUCCINATE ER 25 MG PO TB24
25.0000 mg | ORAL_TABLET | Freq: Every day | ORAL | Status: DC
  Administered 2017-11-07 – 2017-11-09 (×3): 25 mg via ORAL
  Filled 2017-11-07 (×3): qty 1

## 2017-11-07 MED ORDER — SODIUM CHLORIDE 0.9% FLUSH: 3.0000 mL | INTRAVENOUS | Status: DC | PRN

## 2017-11-07 MED ORDER — GUAIFENESIN-DM 100-10 MG/5ML PO SYRP
5.0000 mL | ORAL_SOLUTION | ORAL | Status: DC | PRN
  Filled 2017-11-07 (×2): qty 10

## 2017-11-07 MED ORDER — FUROSEMIDE 10 MG/ML IJ SOLN
60.0000 mg | Freq: Once | INTRAMUSCULAR | Status: AC
  Administered 2017-11-07: 60 mg via INTRAVENOUS
  Filled 2017-11-07: qty 8

## 2017-11-07 MED ORDER — ASPIRIN EC 81 MG PO TBEC
81.0000 mg | DELAYED_RELEASE_TABLET | Freq: Every day | ORAL | Status: DC
  Administered 2017-11-07 – 2017-11-09 (×3): 81 mg via ORAL
  Filled 2017-11-07 (×3): qty 1

## 2017-11-07 MED ORDER — MAGNESIUM SULFATE 2 GM/50ML IV SOLN
2.0000 g | Freq: Once | INTRAVENOUS | Status: AC
  Administered 2017-11-07: 2 g via INTRAVENOUS
  Filled 2017-11-07: qty 50

## 2017-11-07 NOTE — ED Notes (Signed)
Bed: WA09 Expected date:  Expected time:  Means of arrival:  Comments: Triage 1 [facility]

## 2017-11-07 NOTE — H&P (Addendum)
History and Physical    Meredith Shah ZOX:[id number] DOB: [birth year] DOA: 11/07/2017  PCP: Patient, No Pcp Per   I have briefly reviewed patients [facility] medical [facility] in [facility].  Patient coming from: home  Chief Complaint: SOB and Orthopnea  HPI: Meredith Shah is a 62 year old female with a past medical history significant for combined congestive heart failure (last echo demonstrating 40-45% ejection fraction), hypertension, morbid obesity and GERD; who presented to the emergency department with complains of shortness of breath and orthopnea.  Patient reports that her symptoms has been present for the last 3-4 weeks and worsening.  By the time that she came today she is having trouble with minimal exertion, laying flat on her bed and is essentially in need to be on tripod position after doing anything due to shortness of breath.  Patient also reported tightness sensation in her torso and dry cough.  No chest pain, no palpitations, no fever, no chills, no nausea, no vomiting, no dysuria, no hematuria, no melena, no hematochezia, no focal weakness..  Of note, she reported running out of her medications approximately a month ago and struggling with low-sodium diet compliance.  ED Course: Patient in mild respiratory distress and uncomfortable, chest x-ray not demonstrating pulmonary edema or significant vascular congestion, BNP falsely low in the setting of obesity; patient with crackles, wheezing and demonstrated positive orthopnea during examination.  She received 60 mg of IV Lasix with immediate output of more than 600 cc. EKG w/o acute ischemic changes, Blood work demonstrated mild hypomagnesemia (which was repleted) and neg troponing. TRH called to admit patient for further relation and treatment of acute on chronic CHF.  Review of Systems:  All other systems reviewed and apart from HPI, are negative.  Past Medical History:  Diagnosis Date  . Arthritis    knee  . Chest  pain 07/07/2017  . CHF (congestive heart failure) (HCC) 07/07/2017  . Hyperlipidemia    diet controlled, no meds  . Hypertension   . Morbid obesity (HCC) 07/07/2017  . OSA (obstructive sleep apnea) 11/07/2017  . SVD (spontaneous vaginal delivery)    x 4    Past Surgical History:  Procedure Laterality Date  . ABDOMINAL HYSTERECTOMY    . BREAST SURGERY     reduction  . btl    . KNEE SURGERY Right   . RIGHT/LEFT HEART CATH AND CORONARY ANGIOGRAPHY N/A 07/09/2017   Procedure: RIGHT/LEFT HEART CATH AND CORONARY ANGIOGRAPHY;  Surgeon: Lyn Records, MD;  Location: [facility];  Service: Cardiovascular;  Laterality: N/A;    Social History  reports that  has never smoked. she has never used smokeless tobacco. She reports that she drinks about 4.2 oz of alcohol per week. She reports that she does not use drugs.  No Known Allergies  Family History  Problem Relation Age of Onset  . Throat cancer Father   . Colon cancer Neg Hx   . Colon polyps Neg Hx   . Rectal cancer Neg Hx   . Stomach cancer Neg Hx      Prior to Admission medications   Medication Sig Start Date End Date Taking? Authorizing Provider  aspirin EC 81 MG EC tablet Take 1 tablet (81 mg total) by mouth daily. 07/11/17   Penny Pia, MD  Calcium Carbonate Antacid (ALKA-SELTZER ANTACID PO) Take 1 tablet by mouth as needed.    [provider]  furosemide (LASIX) 40 MG tablet Take 1 tablet (40 mg total) by mouth  2 (two) times daily. 08/11/17   Rosalio Macadamia, NP  lisinopril (PRINIVIL,ZESTRIL) 5 MG tablet Take 1 tablet (5 mg total) by mouth daily. 08/11/17   Rosalio Macadamia, NP  metoprolol succinate (TOPROL-XL) 25 MG 24 hr tablet Take 1 tablet (25 mg total) by mouth daily. 08/11/17   Rosalio Macadamia, NP    Physical Exam: Vitals:   11/07/17 0557 11/07/17 0640  BP: (!) 152/103 127/88  Pulse: 99 90  Resp: (!) 30 (!) 22  Temp: 98.1 F (36.7 C)   TempSrc: Oral   SpO2: 97% 100%  Weight: 125.6 kg (277 lb)    Height: 5\' 6"  (1.676 m)       Constitutional: Mild respiratory distress and requiring to be in upright position on the stretcher breathing slightly more comfortable.  Reports no chest pain, no nausea, no vomiting.  Patient afebrile with some intermittent dry coughing spells during examination. Eyes: PERTLA, lids and conjunctivae normal, no icterus, no nystagmus. ENMT: Mucous membranes are moist. Posterior pharynx clear of any exudate or lesions. Normal dentition.  Neck: supple, no masses, no thyromegaly, mild JVD appreciated (even difficult to assess secondary to body habitus). Respiratory: Expiratory wheezing, decreased breath sounds at the bases, fine crackles appreciated. No using accessory muscles.  Patient with 2 L of nasal cannula oxygen supplementation for comfort.   Cardiovascular: Mild tachycardia, S1 & S2 heard, sinus rhythm, no murmurs / rubs / gallops. No extremity edema. 2+ pedal pulses. No carotid bruits.  Abdomen: Obese, per patient mildly distended, no tenderness, no masses palpated. No hepatosplenomegaly. Bowel sounds normal.  Musculoskeletal: no clubbing / cyanosis. No joint deformity upper and lower extremities. Good ROM, no contractures. Normal muscle tone.  Skin: no rashes, lesions, ulcers. No induration Neurologic: CN 2-12 grossly intact. Sensation intact, DTR normal. Strength 5/5 in all 4 limbs.  Psychiatric: Normal judgment and insight. Alert and oriented x 3. Normal mood.    Labs on Admission: I have personally reviewed following labs and imaging studies  CBC: Recent Labs  Lab 11/05/17 1650 11/07/17 0616 11/07/17 0630  WBC 7.0 7.2  --   HGB 14.7 14.4 15.0  HCT 43.6 42.3 44.0  MCV 87.2 86.3  --   PLT 178 164  --    Basic Metabolic Panel: Recent Labs  Lab 11/05/17 1650 11/07/17 0616 11/07/17 0630  NA 139 138 142  K 3.8 3.7 3.6  CL 105 104 103  CO2 21* 23  --   GLUCOSE 87 118* 119*  BUN 14 8 6   CREATININE 0.82 0.74 0.70  CALCIUM 8.7* 8.5*  --     MG  --  1.5*  --    Liver Function Tests: Recent Labs  Lab 11/07/17 0616  AST 38  ALT 26  ALKPHOS 77  BILITOT 0.6  PROT 7.6  ALBUMIN 3.7   Cardiac Enzymes: Recent Labs  Lab 11/05/17 1650  TROPONINI <0.03   Urine analysis:    Component Value Date/Time   COLORURINE AMBER (A) 12/11/2015 0950   APPEARANCEUR HAZY (A) 12/11/2015 0950   LABSPEC 1.019 12/11/2015 0950   PHURINE 7.0 12/11/2015 0950   GLUCOSEU NEGATIVE 12/11/2015 0950   HGBUR NEGATIVE 12/11/2015 0950   BILIRUBINUR NEGATIVE 12/11/2015 0950   KETONESUR NEGATIVE 12/11/2015 0950   PROTEINUR NEGATIVE 12/11/2015 0950   UROBILINOGEN 0.2 08/26/2014 1731   NITRITE NEGATIVE 12/11/2015 0950   LEUKOCYTESUR NEGATIVE 12/11/2015 0950     Radiological Exams on Admission: Dg Chest 2 View  Result Date: 11/07/2017 CLINICAL DATA:  Shortness of breath EXAM: CHEST  2 VIEW COMPARISON:  Chest radiograph 11/05/2017 FINDINGS: Mild cardiomegaly, unchanged. Thoracic spine degenerative changes. Both lungs are clear. The visualized skeletal structures are unremarkable. IMPRESSION: No active cardiopulmonary disease. Electronically Signed   By: Deatra Robinson M.D.   On: 11/07/2017 06:22   Dg Chest 2 View  Result Date: 11/05/2017 CLINICAL DATA:  Patient with shortness of breath. EXAM: CHEST  2 VIEW COMPARISON:  Chest radiograph 07/06/2017. FINDINGS: Stable cardiomegaly. Basilar atelectasis. No large area of pulmonary consolidation. No pleural effusion or pneumothorax. Thoracic spine degenerative changes. IMPRESSION: No acute cardiopulmonary process. Electronically Signed   By: Annia Belt M.D.   On: 11/05/2017 18:24    EKG:  Normal axis, sinus rhythm, no acute ischemic changes.  Assessment/Plan 1-acute on chronic combined CHF  -Patient admitted to telemetry bed -Will follow daily weights, strict intake and output and restricted sodium diet. -started on IV lasix 40mg  BID (received 60mg  X 1 in ED) -close follow up of electrolytes and renal  function  -will repeat 2-D echo -continue B-blocker and low dose ACE -will check troponin (not surprised if she had mild flat elevation due to demand ischemia in the setting of CHF exacerbation) -Long discussion about medication compliance, importance of taking her weight on daily basis and following a low-sodium diet provided.  2-Essential hypertension -follow low sodium diet  -resume B-blocker and ACE -PRN hydralazine ordered   3-Morbid obesity (HCC) -Low calorie diet and exercise discussed with patient -Body mass index is 44.23 kg/m.  4-HLD (hyperlipidemia) -was just following diet control -will check lipid panel, as her diet might not be going that well overall  5-OSA (obstructive sleep apnea) -in the process of having sleep study done as an outpatient  -will give trial CPAP QHS on auto-mode   6-Hypomagnesemia -repleted -follow electrolyte trend  -monitoring on telemetry   7-GERD (gastroesophageal reflux disease)  -will use PPI  Time: 65 minutes    DVT prophylaxis: Lovenox Code Status: Full code Family Communication: No family at bedside Disposition Plan: Discharge back home once medically stable. Consults called:  none Admission status: Telemetry bed, inpatient status, LOS more than 2 midnights.   Vassie Loll MD [facility] Pager [phone]  If 7PM-7AM, please contact night-coverage [email or web address] Password [email or web address]  11/07/2017, 9:01 AM

## 2017-11-07 NOTE — Progress Notes (Signed)
MD updated critical troponin 0.04. Called back. No new orders given. Will cont to mon. TROPONIN level

## 2017-11-07 NOTE — Plan of Care (Signed)
Con to mon 

## 2017-11-07 NOTE — ED Provider Notes (Signed)
[facility]-EMERGENCY DEPT Provider Note   CSN: [id number] Arrival date & time: 11/07/17  0551     History   Chief Complaint Chief Complaint  Patient presents with  . Shortness of Breath    HPI  Blood pressure (!) 152/103, pulse 99, temperature 98.1 F (36.7 C), temperature source Oral, resp. rate (!) 30, height 5\' 6"  (1.676 m), weight 125.6 kg (277 lb), SpO2 97 %.  Meredith Shah is a 62 y.o. female complaining of shortness of breath, dyspnea on exertion, orthopnea (4 pillow) and PND worsening over the course of last 2 weeks.  She finds it hard to complete her ADLs.  She has a history of CHF, hypertension, hyperlipidemia and morbid obesity.  She states she has been noncompliant with her Lasix for over a month, she has not followed a low-sodium diet, she has not taken daily weights.  She does not regularly see a cardiologist.  She endorses dry cough with no fever, chills, calf swelling, leg pain, history of DVT/PE, increasing peripheral edema.   Past Medical History:  Diagnosis Date  . Arthritis    knee  . Chest pain 07/07/2017  . CHF (congestive heart failure) (HCC) 07/07/2017  . Hyperlipidemia    diet controlled, no meds  . Hypertension   . Morbid obesity (HCC) 07/07/2017  . SVD (spontaneous vaginal delivery)    x 4    Patient Active Problem List   Diagnosis Date Noted  . Acute CHF (congestive heart failure) (HCC) 07/07/2017  . Chest pain 07/07/2017  . Essential hypertension 07/07/2017  . CHF (congestive heart failure) (HCC) 07/07/2017  . Morbid obesity (HCC) 07/07/2017    Past Surgical History:  Procedure Laterality Date  . ABDOMINAL HYSTERECTOMY    . BREAST SURGERY     reduction  . btl    . KNEE SURGERY Right   . RIGHT/LEFT HEART CATH AND CORONARY ANGIOGRAPHY N/A 07/09/2017   Procedure: RIGHT/LEFT HEART CATH AND CORONARY ANGIOGRAPHY;  Surgeon: Lyn Records, MD;  Location: [facility] INVASIVE CV LAB;  Service: Cardiovascular;  Laterality: N/A;     OB History    No data available       Home Medications    Prior to Admission medications   Medication Sig Start Date End Date Taking? Authorizing Provider  aspirin EC 81 MG EC tablet Take 1 tablet (81 mg total) by mouth daily. 07/11/17   Penny Pia, MD  Calcium Carbonate Antacid (ALKA-SELTZER ANTACID PO) Take 1 tablet by mouth as needed.    [provider]  furosemide (LASIX) 40 MG tablet Take 1 tablet (40 mg total) by mouth 2 (two) times daily. 08/11/17   Rosalio Macadamia, NP  lisinopril (PRINIVIL,ZESTRIL) 5 MG tablet Take 1 tablet (5 mg total) by mouth daily. 08/11/17   Rosalio Macadamia, NP  metoprolol succinate (TOPROL-XL) 25 MG 24 hr tablet Take 1 tablet (25 mg total) by mouth daily. 08/11/17   Rosalio Macadamia, NP    Family History Family History  Problem Relation Age of Onset  . Throat cancer Father   . Colon cancer Neg Hx   . Colon polyps Neg Hx   . Rectal cancer Neg Hx   . Stomach cancer Neg Hx     Social History Social History   Tobacco Use  . Smoking status: Never Smoker  . Smokeless tobacco: Never Used  Substance Use Topics  . Alcohol use: Yes    Alcohol/week: 4.2 oz    Types: 7 Standard drinks  or equivalent per week    Comment: beer/wine and liquor  . Drug use: No     Allergies   Patient has no known allergies.   Review of Systems Review of Systems  A complete review of systems was obtained and all systems are negative except as noted in the HPI and PMH.    Physical Exam Updated Vital Signs BP 127/88   Pulse 90   Temp 98.1 F (36.7 C) (Oral)   Resp (!) 22   Ht 5\' 6"  (1.676 m)   Wt 125.6 kg (277 lb)   SpO2 100%   BMI 44.71 kg/m   Physical Exam  Constitutional: She is oriented to person, place, and time. She appears well-developed and well-nourished. No distress.  HENT:  Head: Normocephalic and atraumatic.  Mouth/Throat: Oropharynx is clear and moist.  Eyes: Conjunctivae and EOM are normal. Pupils are equal, round, and  reactive to light.  Neck: Normal range of motion.  Cardiovascular: Normal rate, regular rhythm and intact distal pulses.  Pulmonary/Chest:  Tachypnea, mild accessory muscle use, wheezing bilateral upper lungs, reduced air movement in the lower lungs.  Abdominal: Soft. She exhibits no distension and no mass. There is no tenderness. There is no rebound and no guarding. No hernia.  Musculoskeletal: Normal range of motion. She exhibits no edema.  Neurological: She is alert and oriented to person, place, and time.  Skin: She is not diaphoretic.  Psychiatric: She has a normal mood and affect.  Nursing note and vitals reviewed.    ED Treatments / Results  Labs (all labs ordered are listed, but only abnormal results are displayed) Labs Reviewed  BASIC METABOLIC PANEL - Abnormal; Notable for the following components:      Result Value   Glucose, Bld 118 (*)    Calcium 8.5 (*)    All other components within normal limits  MAGNESIUM - Abnormal; Notable for the following components:   Magnesium 1.5 (*)    All other components within normal limits  HEPATIC FUNCTION PANEL - Abnormal; Notable for the following components:   Bilirubin, Direct <0.1 (*)    All other components within normal limits  I-STAT CHEM 8, ED - Abnormal; Notable for the following components:   Glucose, Bld 119 (*)    Calcium, Ion 1.05 (*)    All other components within normal limits  CBC  BRAIN NATRIURETIC PEPTIDE  I-STAT TROPONIN, ED  I-STAT BETA HCG BLOOD, ED ([facility], [facility], [facility] ONLY)    EKG  EKG Interpretation  Date/Time:  Sunday November 07 2017 06:02:01 EST Ventricular Rate:  94 PR Interval:    QRS Duration: 107 QT Interval:  351 QTC Calculation: 439 R Axis:   -21 Text Interpretation:  Sinus rhythm Atrial premature complex Borderline left axis deviation Borderline abnrm T, anterolateral leads No significant change since last tracing Confirmed by Gilda Crease [phone]) on 11/07/2017 6:11:06 AM        Radiology Dg Chest 2 View  Result Date: 11/07/2017 CLINICAL DATA:  Shortness of breath EXAM: CHEST  2 VIEW COMPARISON:  Chest radiograph 11/05/2017 FINDINGS: Mild cardiomegaly, unchanged. Thoracic spine degenerative changes. Both lungs are clear. The visualized skeletal structures are unremarkable. IMPRESSION: No active cardiopulmonary disease. Electronically Signed   By: Deatra Robinson M.D.   On: 11/07/2017 06:22   Dg Chest 2 View  Result Date: 11/05/2017 CLINICAL DATA:  Patient with shortness of breath. EXAM: CHEST  2 VIEW COMPARISON:  Chest radiograph 07/06/2017. FINDINGS: Stable cardiomegaly. Basilar atelectasis. No large  area of pulmonary consolidation. No pleural effusion or pneumothorax. Thoracic spine degenerative changes. IMPRESSION: No acute cardiopulmonary process. Electronically Signed   By: Annia Belt M.D.   On: 11/05/2017 18:24    Procedures Procedures (including critical care time)  Medications Ordered in ED Medications  magnesium oxide (MAG-OX) tablet 400 mg (not administered)  furosemide (LASIX) injection 60 mg (60 mg Intravenous Given 11/07/17 9381)     Initial Impression / Assessment and Plan / ED Course  I have reviewed the triage vital signs and the nursing notes.  Pertinent labs & imaging results that were available during my care of the patient were reviewed by me and considered in my medical decision making (see chart for details).     Vitals:   11/07/17 0557 11/07/17 0640  BP: (!) 152/103 127/88  Pulse: 99 90  Resp: (!) 30 (!) 22  Temp: 98.1 F (36.7 C)   TempSrc: Oral   SpO2: 97% 100%  Weight: 125.6 kg (277 lb)   Height: 5\' 6"  (1.676 m)     Medications  magnesium oxide (MAG-OX) tablet 400 mg (not administered)  furosemide (LASIX) injection 60 mg (60 mg Intravenous Given 11/07/17 0635)    Meredith Shah is 62 y.o. female presenting with shortness of breath, dyspnea on exertion, orthopnea, PND, increased work of breathing with tachypnea.   Patient has been noncompliant with her Lasix, sodium restriction and daily weights.  Lung sounds with wheezing in the upper fields, reduced air movement in the lower fields.  Patient will be given 60 of Lasix IV.  No signs or symptoms consistent with DVT, no hypoxia or tachycardia, I doubt this is PE.  Chest x-ray without overt edema.  Clinically this is a CHF exacerbation.  Patient has urinated over 600 cc of urine and remains dyspneic with increased work of breathing, will need admission for further diuresis.    Final Clinical Impressions(s) / ED Diagnoses   Final diagnoses:  Acute on chronic congestive heart failure, unspecified heart failure type [facility])  Medical non-compliance    ED Discharge Orders    None       Kaylyn Lim 11/07/17 8299    Bethann Berkshire, MD 11/07/17 [phone]

## 2017-11-07 NOTE — Progress Notes (Signed)
Pt refused CPAP. She stated she has never used a Cpap machine at home and chooses not to use it here. RT will continue to monitor.

## 2017-11-07 NOTE — ED Triage Notes (Signed)
States for 2 weeks increasing shortness of breath now states she is unable to lie down due to shortness of breath able to speak in full sentences.

## 2017-11-08 ENCOUNTER — Inpatient Hospital Stay (HOSPITAL_COMMUNITY): Payer: BLUE CROSS/BLUE SHIELD

## 2017-11-08 DIAGNOSIS — I509 Heart failure, unspecified: Secondary | ICD-10-CM

## 2017-11-08 DIAGNOSIS — E785 Hyperlipidemia, unspecified: Secondary | ICD-10-CM

## 2017-11-08 DIAGNOSIS — K219 Gastro-esophageal reflux disease without esophagitis: Secondary | ICD-10-CM

## 2017-11-08 LAB — BASIC METABOLIC PANEL
Anion gap: 8 (ref 5–15)
BUN: 13 mg/dL (ref 6–20)
CHLORIDE: 104 mmol/L (ref 101–111)
CO2: 28 mmol/L (ref 22–32)
CREATININE: 0.87 mg/dL (ref 0.44–1.00)
Calcium: 8.4 mg/dL — ABNORMAL LOW (ref 8.9–10.3)
GFR calc Af Amer: >60 mL/min (ref >60)
GFR calc non Af Amer: >60 mL/min (ref >60)
GLUCOSE: 119 mg/dL — AB (ref 65–99)
POTASSIUM: 3.6 mmol/L (ref 3.5–5.1)
Sodium: 140 mmol/L (ref 135–145)

## 2017-11-08 LAB — LIPID PANEL
CHOL/HDL RATIO: 5.2 ratio
Cholesterol: 212 mg/dL — ABNORMAL HIGH (ref 0–200)
HDL: 41 mg/dL (ref >40)
LDL CALC: 136 mg/dL — AB (ref 0–99)
Triglycerides: 176 mg/dL — ABNORMAL HIGH (ref <150)
VLDL: 35 mg/dL (ref 0–40)

## 2017-11-08 LAB — ECHOCARDIOGRAM COMPLETE
Height: 66 in
WEIGHTICAEL: 4405.67 [oz_av]

## 2017-11-08 LAB — MAGNESIUM: Magnesium: 1.9 mg/dL (ref 1.7–2.4)

## 2017-11-08 MED ORDER — PERFLUTREN LIPID MICROSPHERE
1.0000 mL | INTRAVENOUS | Status: AC | PRN
  Administered 2017-11-08: 2 mL via INTRAVENOUS
  Filled 2017-11-08: qty 10

## 2017-11-08 MED ORDER — PERFLUTREN LIPID MICROSPHERE
INTRAVENOUS | Status: AC
  Filled 2017-11-08: qty 10

## 2017-11-08 MED ORDER — IPRATROPIUM-ALBUTEROL 0.5-2.5 (3) MG/3ML IN SOLN: 3.0000 mL | Freq: Two times a day (BID) | RESPIRATORY_TRACT | Status: DC

## 2017-11-08 MED ORDER — IPRATROPIUM-ALBUTEROL 0.5-2.5 (3) MG/3ML IN SOLN
3.0000 mL | Freq: Four times a day (QID) | RESPIRATORY_TRACT | Status: DC
  Administered 2017-11-08 – 2017-11-09 (×2): 3 mL via RESPIRATORY_TRACT
  Filled 2017-11-08 (×2): qty 3

## 2017-11-08 MED ORDER — IPRATROPIUM-ALBUTEROL 0.5-2.5 (3) MG/3ML IN SOLN
3.0000 mL | Freq: Four times a day (QID) | RESPIRATORY_TRACT | Status: DC
  Administered 2017-11-08: 3 mL via RESPIRATORY_TRACT
  Filled 2017-11-08: qty 3

## 2017-11-08 MED ORDER — FUROSEMIDE 10 MG/ML IJ SOLN
40.0000 mg | Freq: Every day | INTRAMUSCULAR | Status: DC
  Administered 2017-11-09: 40 mg via INTRAVENOUS
  Filled 2017-11-08: qty 4

## 2017-11-08 MED ORDER — METHYLPREDNISOLONE SODIUM SUCC 125 MG IJ SOLR
60.0000 mg | Freq: Four times a day (QID) | INTRAMUSCULAR | Status: DC
  Administered 2017-11-08 – 2017-11-09 (×5): 60 mg via INTRAVENOUS
  Filled 2017-11-08 (×5): qty 2

## 2017-11-08 NOTE — Progress Notes (Signed)
RT NOTE:  Pt refuses CPAP tonight. RT available is pt changes her mind.

## 2017-11-08 NOTE — Progress Notes (Signed)
  Echocardiogram 2D Echocardiogram has been performed.  Roosvelt Maser F 11/08/2017, 2:02 PM

## 2017-11-08 NOTE — Progress Notes (Signed)
[facility]  PROGRESS NOTE  Meredith Shah QAS:[id number] DOB: September 01, 1956 DOA: 11/07/2017 PCP: Patient, No Pcp Per   Brief HPI:   62 year old female with a past medical history significant for combined congestive heart failure (last echo demonstrating 40-45 ejection fraction), hypertension, morbid obesity and GERD; who presented to the emergency department with complaint of shortness of breath and orthopnea.  Patient reports that her symptom has been present for the last 3-4 weeks and worsening.  By the time that she came today she is having trouble with minimal exertion, laying flat on her bed and is in need to be on tripod position after doing anything due to shortness of breath.  Patient also reported tightness sensation in her torso and dry cough.  No chest pain, no palpitations, no fever, no chills, no nausea, no vomiting, no dysuria, no hematuria, no melena, no hematochezia, no focal weakness..    Subjective   Patient seen and examined, still short of breath.   Assessment/Plan:     1. Acute on chronic combined diastolic and systolic CHF- improved, will change Lasix to 40 mg daily. Echocardiogram done today, result is pending. 2. Mild elevation of troponin- .05, likely from demand ischemia. No chest pain. EKG showed no ischemic changes. 3. Acute bronchitis- patient has significant wheezing bilaterally, will start Solu-Medrol 60 mg IV Q6 hours, Duoneb  nebulizer Q6 hours, Robitussin DM 5 ML Q4 hours PRN 4. Essential hypertension-blood pressure is controlled, continue lisinopril, metoprolol 5. Obstructive sleep apnea - patient is supposed to get sleep study as outpatient 6. Hypomagnesemia-replete, magnesium is 1.9 today     DVT prophylaxis: Lovenox  Code Status: Full code  Family Communication: No family at bedside  Disposition Plan: Home in 1-2 days   Consultants:  None   Procedures:  None   Continuous infusions . sodium chloride        Antibiotics:    Anti-infectives (From admission, onward)   None       Objective   Vitals:   11/07/17 2100 11/08/17 0433 11/08/17 1309 11/08/17 1500  BP: 118/69 124/85  117/76  Pulse: 90 91  91  Resp: 18 16  20   Temp: 98.4 F (36.9 C) 99.4 F (37.4 C)  99.2 F (37.3 C)  TempSrc: Oral Oral  Oral  SpO2: 95% 98% 93% 95%  Weight:  124.9 kg (275 lb 5.7 oz)    Height:        Intake/Output Summary (Last 24 hours) at 11/08/2017 1555 Last data filed at 11/08/2017 1300 Gross per 24 hour  Intake 480 ml  Output 200 ml  Net 280 ml   Filed Weights   11/07/17 0557 11/07/17 1020 11/08/17 0433  Weight: 125.6 kg (277 lb) 124.3 kg (274 lb 0.5 oz) 124.9 kg (275 lb 5.7 oz)     Physical Examination:   Physical Exam: Eyes: No icterus, extraocular muscles intact  Mouth: Oral mucosa is moist, no lesions on palate,  Neck: Supple, no deformities, masses, or tenderness Lungs: Normal respiratory effort, bilateral wheezing.  Heart: Regular rate and rhythm, S1 and S2 normal, no murmurs, rubs auscultated Abdomen: BS normoactive,soft,nondistended,non-tender to palpation,no organomegaly Extremities: No pretibial edema, no erythema, no cyanosis, no clubbing Neuro : Alert and oriented to time, place and person, No focal deficits  Skin: No rashes seen on exam     Data Reviewed: I have personally reviewed following labs and imaging studies  CBG: No results for input(s): GLUCAP in the last 168 hours.  CBC: Recent Labs  Lab 11/05/17 1650 11/07/17 0616 11/07/17 0630 11/07/17 0909  WBC 7.0 7.2  --  7.3  HGB 14.7 14.4 15.0 14.9  HCT 43.6 42.3 44.0 43.3  MCV 87.2 86.3  --  87.3  PLT 178 164  --  149*    Basic Metabolic Panel: Recent Labs  Lab 11/05/17 1650 11/07/17 0616 11/07/17 0630 11/07/17 0909 11/08/17 0504  NA 139 138 142  --  140  K 3.8 3.7 3.6  --  3.6  CL 105 104 103  --  104  CO2 21* 23  --   --  28  GLUCOSE 87 118* 119*  --  119*  BUN 14 8 6   --  13  CREATININE 0.82 0.74 0.70 0.80  0.87  CALCIUM 8.7* 8.5*  --   --  8.4*  MG  --  1.5*  --   --  1.9    No results found for this or any previous visit (from the past 240 hour(s)).   Liver Function Tests: Recent Labs  Lab 11/07/17 0616  AST 38  ALT 26  ALKPHOS 77  BILITOT 0.6  PROT 7.6  ALBUMIN 3.7   No results for input(s): LIPASE, AMYLASE in the last 168 hours. No results for input(s): AMMONIA in the last 168 hours.  Cardiac Enzymes: Recent Labs  Lab 11/05/17 1650 11/07/17 0909 11/07/17 1523 11/07/17 2118  TROPONINI <0.03 0.04* 0.04* 0.05*   BNP (last 3 results) Recent Labs    07/06/17 2232 11/07/17 0616  BNP 191.4* 89.1    ProBNP (last 3 results) No results for input(s): PROBNP in the last 8760 hours.    Studies: Dg Chest 2 View  Result Date: 11/07/2017 CLINICAL DATA:  Shortness of breath EXAM: CHEST  2 VIEW COMPARISON:  Chest radiograph 11/05/2017 FINDINGS: Mild cardiomegaly, unchanged. Thoracic spine degenerative changes. Both lungs are clear. The visualized skeletal structures are unremarkable. IMPRESSION: No active cardiopulmonary disease. Electronically Signed   By: Deatra Robinson M.D.   On: 11/07/2017 06:22    Scheduled Meds: . aspirin EC  81 mg Oral Daily  . enoxaparin (LOVENOX) injection  60 mg Subcutaneous Q24H  . [START ON 11/09/2017] furosemide  40 mg Intravenous Daily  . ipratropium-albuterol  3 mL Nebulization BID  . lisinopril  5 mg Oral Daily  . methylPREDNISolone (SOLU-MEDROL) injection  60 mg Intravenous Q6H  . metoprolol succinate  25 mg Oral Daily  . pantoprazole  40 mg Oral Q1200  . potassium chloride  20 mEq Oral Daily  . sodium chloride flush  3 mL Intravenous Q12H      Time spent: 25 min  Meredeth Shah   [facility] Pager [phone]. If 7PM-7AM, please contact night-coverage at [email or web address], Office  [phone]  password [email or web address]  11/08/2017, 3:55 PM  LOS: 1 day

## 2017-11-09 ENCOUNTER — Encounter (HOSPITAL_COMMUNITY): Payer: Self-pay | Admitting: Physician Assistant

## 2017-11-09 DIAGNOSIS — I5043 Acute on chronic combined systolic (congestive) and diastolic (congestive) heart failure: Secondary | ICD-10-CM

## 2017-11-09 DIAGNOSIS — J440 Chronic obstructive pulmonary disease with acute lower respiratory infection: Secondary | ICD-10-CM

## 2017-11-09 DIAGNOSIS — J209 Acute bronchitis, unspecified: Secondary | ICD-10-CM

## 2017-11-09 DIAGNOSIS — I10 Essential (primary) hypertension: Secondary | ICD-10-CM

## 2017-11-09 LAB — BASIC METABOLIC PANEL
ANION GAP: 10 (ref 5–15)
BUN: 21 mg/dL — ABNORMAL HIGH (ref 6–20)
CALCIUM: 9 mg/dL (ref 8.9–10.3)
CO2: 23 mmol/L (ref 22–32)
CREATININE: 0.96 mg/dL (ref 0.44–1.00)
Chloride: 102 mmol/L (ref 101–111)
GFR calc Af Amer: >60 mL/min (ref >60)
Glucose, Bld: 233 mg/dL — ABNORMAL HIGH (ref 65–99)
Potassium: 4.1 mmol/L (ref 3.5–5.1)
Sodium: 135 mmol/L (ref 135–145)

## 2017-11-09 MED ORDER — LISINOPRIL 5 MG PO TABS: 5.0000 mg | ORAL_TABLET | Freq: Every day | ORAL | 6 refills | Status: AC

## 2017-11-09 MED ORDER — LIVING BETTER WITH HEART FAILURE BOOK
Freq: Once | Status: AC
  Administered 2017-11-09: 11:00:00

## 2017-11-09 MED ORDER — METOPROLOL SUCCINATE ER 25 MG PO TB24: 25.0000 mg | ORAL_TABLET | Freq: Every day | ORAL | 6 refills | Status: AC

## 2017-11-09 MED ORDER — ALBUTEROL SULFATE HFA 108 (90 BASE) MCG/ACT IN AERS: 2.0000 | INHALATION_SPRAY | Freq: Four times a day (QID) | RESPIRATORY_TRACT | 2 refills | Status: AC | PRN

## 2017-11-09 MED ORDER — IPRATROPIUM-ALBUTEROL 0.5-2.5 (3) MG/3ML IN SOLN: 3.0000 mL | Freq: Two times a day (BID) | RESPIRATORY_TRACT | Status: DC

## 2017-11-09 MED ORDER — PREDNISONE 10 MG PO TABS: ORAL_TABLET | ORAL | 0 refills | Status: AC

## 2017-11-09 MED ORDER — GUAIFENESIN-DM 100-10 MG/5ML PO SYRP: 5.0000 mL | ORAL_SOLUTION | ORAL | 0 refills | Status: AC | PRN

## 2017-11-09 MED ORDER — ASPIRIN 81 MG PO TBEC: 81.0000 mg | DELAYED_RELEASE_TABLET | Freq: Every day | ORAL | 1 refills | Status: AC

## 2017-11-09 MED ORDER — POTASSIUM CHLORIDE CRYS ER 10 MEQ PO TBCR: 10.0000 meq | EXTENDED_RELEASE_TABLET | Freq: Every day | ORAL | 1 refills | Status: AC

## 2017-11-09 MED ORDER — FUROSEMIDE 40 MG PO TABS: 40.0000 mg | ORAL_TABLET | Freq: Every day | ORAL | 6 refills | Status: AC

## 2017-11-09 NOTE — Consult Note (Signed)
Cardiology Consultation:   Patient ID: Meredith Shah; [id number]; [birth year]   Admit date: 11/07/2017 Date of Consult: 11/09/2017  Primary Care Provider: Patient, No Pcp Per Primary Cardiologist: Dr. Katrinka Blazing  Chief Complaint: SOB  Patient Profile:   Meredith Shah is a 62 y.o. female with a hx of morbid obesity and suspected OSA, NICM, chronic combined CHF, alcohol abuse, HTN, HLD who is being seen today for the evaluation of CHF at the request of Dr. Sharl Ma.  History of Present Illness:   She was initially diagnosed with HF in 06/2017 with EF 40-45%, diffuse HK, normal diastolic parameters, mild LAE -> R/LHC showed normal coronaries but LVEF 20-25%, LVEDP , mild pulm HTN. She saw Norma Fredrickson on 08/11/17 who increased BB, ordered sleep study and arranged follow-up but the patient canceled her sleep study and no-showed to her follow-up. She cites moving to [place], [place] in the interim as the reason she was lost to follow-up. She did not yet establish with a cardiologist there but states she has an appointment to see her primary care doctor this Thursday.  She initially presented to the ED 1/25 for SOB but left after triage. She presented back to the ED 11/07/2017 with worsening SOB and orthopnea over the past 3-4 weeks, now worsening to the point of minimal exertion provoking symptoms and having to sit up in bed. She had run out of her medications 1 month ago (coinciding with onset of symptoms) and also reported noncompliance with low sodium diet. She also had early satiety and abdominal bloating.   Initial BP 152/103. Labs revealed BNP 89, troponins low/flat peak 0.05, LDL 136, normal CBC, Mg 1.5, albumin 3.7, glucose 118, normal Cr, TSH wnl, hCG wnl. CXR NAD. 2D echo yesterday showed mild LVH, EF 45%, diffuse HK, grade 2 DD, mild LAE/RAE, normal RV. She was diuresed with IV Lasix the last 2 days with significant clinical improvement per patient. She was also treated for possible bronchitis  with steroids/nebs. I/O's -1.3L, -2lb (weight now 2lb below office weight in October). No chest pain, syncope, LEE. She states she now feels great and back to baseline. She is hopeful to go home today.    Past Medical History:  Diagnosis Date  . Alcohol abuse   . Arthritis    knee  . Chronic combined systolic and diastolic CHF (congestive heart failure) (HCC)   . Hyperlipidemia    diet controlled, no meds  . Hypertension   . Morbid obesity (HCC)   . NICM (nonischemic cardiomyopathy) (HCC)   . OSA (obstructive sleep apnea)   . SVD (spontaneous vaginal delivery)    x 4    Past Surgical History:  Procedure Laterality Date  . ABDOMINAL HYSTERECTOMY    . BREAST SURGERY     reduction  . btl    . KNEE SURGERY Right   . RIGHT/LEFT HEART CATH AND CORONARY ANGIOGRAPHY N/A 07/09/2017   Procedure: RIGHT/LEFT HEART CATH AND CORONARY ANGIOGRAPHY;  Surgeon: Lyn Records, MD;  Location: [facility];  Service: Cardiovascular;  Laterality: N/A;     Inpatient Medications: Scheduled Meds: . aspirin EC  81 mg Oral Daily  . enoxaparin (LOVENOX) injection  60 mg Subcutaneous Q24H  . furosemide  40 mg Intravenous Daily  . ipratropium-albuterol  3 mL Nebulization BID  . lisinopril  5 mg Oral Daily  . methylPREDNISolone (SOLU-MEDROL) injection  60 mg Intravenous Q6H  . metoprolol succinate  25 mg Oral Daily  . pantoprazole  40  mg Oral Q1200  . potassium chloride  20 mEq Oral Daily  . sodium chloride flush  3 mL Intravenous Q12H   Continuous Infusions: . sodium chloride     PRN Meds: sodium chloride, acetaminophen, guaiFENesin-dextromethorphan, hydrALAZINE, ondansetron (ZOFRAN) IV, sodium chloride flush  Home Meds: Prior to Admission medications   Medication Sig Start Date End Date Taking? Authorizing Provider  furosemide (LASIX) 40 MG tablet Take 1 tablet (40 mg total) by mouth 2 (two) times daily. 08/11/17  Yes Rosalio Macadamia, NP  lisinopril (PRINIVIL,ZESTRIL) 5 MG tablet Take  1 tablet (5 mg total) by mouth daily. 08/11/17  Yes Rosalio Macadamia, NP  metoprolol succinate (TOPROL-XL) 25 MG 24 hr tablet Take 1 tablet (25 mg total) by mouth daily. 08/11/17  Yes Rosalio Macadamia, NP  aspirin EC 81 MG EC tablet Take 1 tablet (81 mg total) by mouth daily. Patient not taking: Reported on 11/07/2017 07/11/17   Penny Pia, MD    Allergies:   No Known Allergies  Social History:   Social History   Socioeconomic History  . Marital status: Significant Other    Spouse name: Not on file  . Number of children: Not on file  . Years of education: Not on file  . Highest education level: Not on file  Social Needs  . Financial resource strain: Not on file  . Food insecurity - worry: Not on file  . Food insecurity - inability: Not on file  . Transportation needs - medical: Not on file  . Transportation needs - non-medical: Not on file  Occupational History  . Not on file  Tobacco Use  . Smoking status: Never Smoker  . Smokeless tobacco: Never Used  Substance and Sexual Activity  . Alcohol use: Yes    Alcohol/week: 4.2 oz    Types: 7 Standard drinks or equivalent per week    Comment: beer/wine and liquor - as of 10/2017, had reduced intake to drinking 1-2 drinks daily  . Drug use: No  . Sexual activity: Yes    Birth control/protection: Post-menopausal  Other Topics Concern  . Not on file  Social History Narrative  . Not on file    Family History:   The patient's family history includes Throat cancer in her father. There is no history of Colon cancer, Colon polyps, Rectal cancer, or Stomach cancer.  ROS:  Please see the history of present illness.  All other ROS reviewed and negative.     Physical Exam/Data:   Vitals:   11/08/17 2055 11/08/17 2216 11/09/17 0623 11/09/17 0909  BP: 94/71  128/84   Pulse: 85 78 86   Resp: 20 19 20    Temp: 97.8 F (36.6 C)  98.2 F (36.8 C)   TempSrc: Oral  Oral   SpO2: 96% 98% 93% 95%  Weight:   275 lb 5.7 oz (124.9 kg)     Height:        Intake/Output Summary (Last 24 hours) at 11/09/2017 0934 Last data filed at 11/09/2017 0388 Gross per 24 hour  Intake 960 ml  Output 1600 ml  Net -640 ml   Filed Weights   11/07/17 1020 11/08/17 0433 11/09/17 0623  Weight: 274 lb 0.5 oz (124.3 kg) 275 lb 5.7 oz (124.9 kg) 275 lb 5.7 oz (124.9 kg)   Body mass index is 44.44 kg/m.  General: Morbidly obese AAF with central obesity in no acute distress. Head: Normocephalic, atraumatic, sclera non-icteric, no xanthomas, nares are without discharge.  Neck: Negative  for carotid bruits. JVD not elevated. Lungs: Clear bilaterally to auscultation without wheezes, rales, or rhonchi. Breathing is unlabored. Heart: RRR with S1 S2. No murmurs, rubs, or gallops appreciated. Abdomen: Soft, non-tender, non-distended with normoactive bowel sounds. No hepatomegaly. No rebound/guarding. No obvious abdominal masses. Msk:  Strength and tone appear normal for age. Extremities: No clubbing or cyanosis. No edema.  Distal pedal pulses are 2+ and equal bilaterally. Neuro: Alert and oriented X 3. No facial asymmetry. No focal deficit. Moves all extremities spontaneously. Psych:  Responds to questions appropriately with a normal affect.  EKG:  The EKG was personally reviewed and demonstrates NSR 94bpm, nonspecific ST-T changes  Relevant CV Studies: 2D Echo 11/08/17 Study Conclusions  - Left ventricle: The cavity size was at the upper limits of   normal. Wall thickness was increased in a pattern of mild LVH.   The estimated ejection fraction was 45%. Diffuse hypokinesis.   Features are consistent with a pseudonormal left ventricular   filling pattern, with concomitant abnormal relaxation and   increased filling pressure (grade 2 diastolic dysfunction). - Aortic valve: There was no stenosis. - Left atrium: The atrium was mildly dilated. - Right ventricle: The cavity size was normal. Systolic function   was normal. - Right atrium: The  atrium was mildly dilated. - Pulmonary arteries: No complete TR doppler jet so unable to   estimate PA systolic pressure. - Systemic veins: IVC poorly visualized.  Impressions:  - Technically difficult study with poor acoustic windows. Upper   normal LV size with EF 45%, diffuse hypokinesis. Moderate   diastolic dysfunction. Normal RV size   Laboratory Data:  Chemistry Recent Labs  Lab 11/07/17 0616 11/07/17 0630 11/07/17 0909 11/08/17 0504 11/09/17 0505  NA 138 142  --  140 135  K 3.7 3.6  --  3.6 4.1  CL 104 103  --  104 102  CO2 23  --   --  28 23  GLUCOSE 118* 119*  --  119* 233*  BUN 8 6  --  13 21*  CREATININE 0.74 0.70 0.80 0.87 0.96  CALCIUM 8.5*  --   --  8.4* 9.0  GFRNONAA >60  --  >60 >60 >60  GFRAA >60  --  >60 >60 >60  ANIONGAP 11  --   --  8 10    Recent Labs  Lab 11/07/17 0616  PROT 7.6  ALBUMIN 3.7  AST 38  ALT 26  ALKPHOS 77  BILITOT 0.6   Hematology Recent Labs  Lab 11/05/17 1650 11/07/17 0616 11/07/17 0630 11/07/17 0909  WBC 7.0 7.2  --  7.3  RBC 5.00 4.90  --  4.96  HGB 14.7 14.4 15.0 14.9  HCT 43.6 42.3 44.0 43.3  MCV 87.2 86.3  --  87.3  MCH 29.4 29.4  --  30.0  MCHC 33.7 34.0  --  34.4  RDW 13.5 13.9  --  13.9  PLT 178 164  --  149*   Cardiac Enzymes Recent Labs  Lab 11/05/17 1650 11/07/17 0909 11/07/17 1523 11/07/17 2118  TROPONINI <0.03 0.04* 0.04* 0.05*    Recent Labs  Lab 11/07/17 0626  TROPIPOC 0.03    BNP Recent Labs  Lab 11/07/17 0616  BNP 89.1    DDimer No results for input(s): DDIMER in the last 168 hours.  Radiology/Studies:  Dg Chest 2 View  Result Date: 11/07/2017 CLINICAL DATA:  Shortness of breath EXAM: CHEST  2 VIEW COMPARISON:  Chest radiograph 11/05/2017 FINDINGS: Mild cardiomegaly,  unchanged. Thoracic spine degenerative changes. Both lungs are clear. The visualized skeletal structures are unremarkable. IMPRESSION: No active cardiopulmonary disease. Electronically Signed   By: Deatra Robinson  M.D.   On: 11/07/2017 06:22   Dg Chest 2 View  Result Date: 11/05/2017 CLINICAL DATA:  Patient with shortness of breath. EXAM: CHEST  2 VIEW COMPARISON:  Chest radiograph 07/06/2017. FINDINGS: Stable cardiomegaly. Basilar atelectasis. No large area of pulmonary consolidation. No pleural effusion or pneumothorax. Thoracic spine degenerative changes. IMPRESSION: No acute cardiopulmonary process. Electronically Signed   By: Annia Belt M.D.   On: 11/05/2017 18:24    Assessment and Plan:   1. Dyspnea/orthopnea, suspect multifactorial from acute on chronic combined CHF (NICM), bronchitis and uncontrolled HTN in setting of medication/dietary noncompliance - she feels clinically improved with the above treatments. Reviewed 2g sodium restriction, 2L fluid restriction, daily weights with patient. I offered to schedule her a TOC visit within a week of discharge but since she lives 1 hour 40 mins away, she does not wish to schedule. She will follow up with her primary care doctor who is also nearby a cardiologist that she plans to establish with ASAP. I reinforced that she is more than welcome to contact our office for any questions (including about meds/daily weights) during this transition so that we can keep her out of the hospital and maintain continuity of care. I put our office number on the follow-up section of the AVS. Will rx CHF book for education as well. As BNP was totally normal suspect she can go out on Toprol 25mg  daily, lisinopril 5mg  daily, Lasix 40mg  daily, and potassium (with instructions that she may take 1 extra tablet of Lasix daily as needed for shortness of breath or weight gain of 3lb). (Blood pressure has been soft at times.) Not currently a good candidate for Entresto or spironolactone given disjointed plan for follow-up but can be considered in the future. Will review these final recs with Dr. Elease Hashimoto.  2. Morbid obesity - recommend primary care arrange sleep study closer to her new  residence.  3. HTN - now controlled.  4. Hyperlipidemia - per IM. Consider therapy given comorbidities.  5. Minimal troponin elevation - not clinically significant at this time; recent cath normal cors.  6. Alcohol abuse - abstinence encouraged. Low Mg level suspected to be due to this; lytes per IM.  For questions or updates, please contact [facility] Please consult [email or web address] for contact info under Cardiology/STEMI.    Signed, Laurann Montana, PA-C  11/09/2017 9:34 AM   Attending Note:   The patient was seen and examined.  Agree with assessment and plan as noted above.  Changes made to the above note as needed.  Patient seen and independently examined with Ronie Spies, PA .   We discussed all aspects of the encounter. I agree with the assessment and plan as stated above.  1.  Acute on chronic combined systolic and diastolic congestive heart failure: Patient presents with worsening shortness of breath.  She has had issues with noncompliance because she ran out of her medications.  She also eats additional salt and fast foods.  She needs to be more mindful of her salt intake and eat more controlled diet.  We have restarted her medications.  She has had a nice diuresis with IV Lasix.  She has now moved to the [place] part of [place] and will follow up with her doctors there.  We have offered to see her if needed.  Troponin elevations: She has had a heart catheterization which revealed normal coronary arteries.  This does not need to be further worked up at this time.  3.  Mild hypertension: Pressures are now well controlled.   I have spent a total of 40 minutes with patient reviewing hospital  notes , telemetry, EKGs, labs and examining patient as well as establishing an assessment and plan that was discussed with the patient. > 50% of time was spent in direct patient care.    Vesta Mixer, Montez Hageman., MD, [facility] 11/09/2017, 1:42 PM [address],  [address] Office [phone] Pager [phone]

## 2017-11-09 NOTE — Progress Notes (Signed)
Heart Failure Education reviewed with patient. Pt acknowledged understanding. Will cont to address questions as needed. SRP, RN

## 2017-11-09 NOTE — Discharge Summary (Signed)
Physician Discharge Summary  Meredith Shah NLG:[id number] DOB: 1956/05/13 DOA: 11/07/2017  PCP: Patient, No Pcp Per  Admit date: 11/07/2017 Discharge date: 11/09/2017  Time spent: 25 minutes  Recommendations for Outpatient Follow-up:  1. Follow up PCP in 2 weeks 2. Follow up Cardiology as needed   Discharge Diagnoses:  Active Problems:   Essential hypertension   Morbid obesity (HCC)   Acute on chronic combined systolic and diastolic CHF (congestive heart failure) (HCC)   HLD (hyperlipidemia)   OSA (obstructive sleep apnea)   Hypomagnesemia   GERD (gastroesophageal reflux disease)   Discharge Condition: Stable  Diet recommendation: heart healthy diet  Filed Weights   11/07/17 1020 11/08/17 0433 11/09/17 0623  Weight: 124.3 kg (274 lb 0.5 oz) 124.9 kg (275 lb 5.7 oz) 124.9 kg (275 lb 5.7 oz)    History of present illness:  62 year old female with a past medical history significant for combined congestive heart failure (last echo demonstrating 40-45 ejection fraction),hypertension, morbid obesityand GERD;who presented to the emergency department with complaint of shortness of breath and orthopnea. Patient reports that her symptom has been present for the last 3-4 weeks and worsening. By the time that she came today she is having trouble with minimal exertion,laying flat on her bed and is in need to be on tripod position after doing anything due to shortness of breath. Patient also reported tightness sensation in her torso and dry cough.    Hospital Course:  1. Acute on chronic combined diastolic and systolic CHF- improved, will change Lasix to 40 mg daily. Echocardiogram showed EF 45%, diffuse hypokinesis.Cardiology was consulted, and recommend patient to continue with Lasix 40 mg po daily, lisinopril 5 mg po daily, Toprol XL 25 mg po daily. 2. Mild elevation of troponin- .05, likely from demand ischemia. No chest pain. EKG showed no ischemic changes. 3. Acute bronchitis-  improved,  patient had significant wheezing bilaterally, she was started on   Solu-Medrol 60 mg IV Q6 hours, Duoneb  nebulizer Q6 hours, Robitussin DM 5 ML Q4 hours PRN. Will discharge home on Prednisone taper< albuterol prn. 4. Essential hypertension-blood pressure is controlled, continue lisinopril, metoprolol 5. Obstructive sleep apnea - patient is supposed to get sleep study as outpatient 6. Hypomagnesemia-replete, magnesium is 1.9      Procedures:  None   Consultations:  Cardiology  Discharge Exam: Vitals:   11/09/17 0623 11/09/17 0909  BP: 128/84   Pulse: 86   Resp: 20   Temp: 98.2 F (36.8 C)   SpO2: 93% 95%    General: Appears in no acute distress Cardiovascular: S1S2 RRR Respiratory: Clear billaterally  Discharge Instructions   Discharge Instructions    Diet - low sodium heart healthy   Complete by:  As directed    Discharge instructions   Complete by:  As directed    You  may take 1 extra tablet of Lasix daily as needed for shortness of breath or weight gain of 3lb   Increase activity slowly   Complete by:  As directed      Allergies as of 11/09/2017   No Known Allergies     Medication List    TAKE these medications   albuterol 108 (90 Base) MCG/ACT inhaler Commonly known as:  PROVENTIL HFA;VENTOLIN HFA Inhale 2 puffs into the lungs every 6 (six) hours as needed for wheezing or shortness of breath.   aspirin 81 MG EC tablet Take 1 tablet (81 mg total) by mouth daily.   furosemide 40 MG tablet Commonly  known as:  LASIX Take 1 tablet (40 mg total) by mouth daily. What changed:  when to take this   guaiFENesin-dextromethorphan 100-10 MG/5ML syrup Commonly known as:  ROBITUSSIN DM Take 5 mLs by mouth every 4 (four) hours as needed for cough.   lisinopril 5 MG tablet Commonly known as:  PRINIVIL,ZESTRIL Take 1 tablet (5 mg total) by mouth daily.   metoprolol succinate 25 MG 24 hr tablet Commonly known as:  TOPROL-XL Take 1 tablet (25 mg  total) by mouth daily.   potassium chloride 10 MEQ tablet Commonly known as:  K-DUR,KLOR-CON Take 1 tablet (10 mEq total) by mouth daily. Start taking on:  11/10/2017   predniSONE 10 MG tablet Commonly known as:  DELTASONE Prednisone 40 mg po daily x 1 day then Prednisone 30 mg po daily x 1 day then Prednisone 20 mg po daily x 1 day then Prednisone 10 mg daily x 1 day then stop...      No Known Allergies Follow-up Information    Lyn Records, MD Follow up.   Specialty:  Cardiology Why:  Please call our office you have any questions about your heart at all, or if you would like to schedule close follow-up. Contact information: [address] [place] [address] [phone]            The results of significant diagnostics from this hospitalization (including imaging, microbiology, ancillary and laboratory) are listed below for reference.    Significant Diagnostic Studies: Dg Chest 2 View  Result Date: 11/07/2017 CLINICAL DATA:  Shortness of breath EXAM: CHEST  2 VIEW COMPARISON:  Chest radiograph 11/05/2017 FINDINGS: Mild cardiomegaly, unchanged. Thoracic spine degenerative changes. Both lungs are clear. The visualized skeletal structures are unremarkable. IMPRESSION: No active cardiopulmonary disease. Electronically Signed   By: Deatra Robinson M.D.   On: 11/07/2017 06:22   Dg Chest 2 View  Result Date: 11/05/2017 CLINICAL DATA:  Patient with shortness of breath. EXAM: CHEST  2 VIEW COMPARISON:  Chest radiograph 07/06/2017. FINDINGS: Stable cardiomegaly. Basilar atelectasis. No large area of pulmonary consolidation. No pleural effusion or pneumothorax. Thoracic spine degenerative changes. IMPRESSION: No acute cardiopulmonary process. Electronically Signed   By: Annia Belt M.D.   On: 11/05/2017 18:24    Microbiology: No results found for this or any previous visit (from the past 240 hour(s)).   Labs: Basic Metabolic Panel: Recent Labs  Lab  11/05/17 1650 11/07/17 0616 11/07/17 0630 11/07/17 0909 11/08/17 0504 11/09/17 0505  NA 139 138 142  --  140 135  K 3.8 3.7 3.6  --  3.6 4.1  CL 105 104 103  --  104 102  CO2 21* 23  --   --  28 23  GLUCOSE 87 118* 119*  --  119* 233*  BUN 14 8 6   --  13 21*  CREATININE 0.82 0.74 0.70 0.80 0.87 0.96  CALCIUM 8.7* 8.5*  --   --  8.4* 9.0  MG  --  1.5*  --   --  1.9  --    Liver Function Tests: Recent Labs  Lab 11/07/17 0616  AST 38  ALT 26  ALKPHOS 77  BILITOT 0.6  PROT 7.6  ALBUMIN 3.7   No results for input(s): LIPASE, AMYLASE in the last 168 hours. No results for input(s): AMMONIA in the last 168 hours. CBC: Recent Labs  Lab 11/05/17 1650 11/07/17 0616 11/07/17 0630 11/07/17 0909  WBC 7.0 7.2  --  7.3  HGB  14.7 14.4 15.0 14.9  HCT 43.6 42.3 44.0 43.3  MCV 87.2 86.3  --  87.3  PLT 178 164  --  149*   Cardiac Enzymes: Recent Labs  Lab 11/05/17 1650 11/07/17 0909 11/07/17 1523 11/07/17 2118  TROPONINI <0.03 0.04* 0.04* 0.05*   BNP: BNP (last 3 results) Recent Labs    07/06/17 2232 11/07/17 0616  BNP 191.4* 89.1       Signed:  [name] MD.  [facility] 11/09/2017, 2:13 PM

## 2017-12-14 NOTE — Telephone Encounter (Signed)
Informed patient of upcoming home sleep study and patient understanding was verbalized. Patient understands her sleep study will be done at HOME with [facility] sleep. Patient understands she will receive a CALL in a week or so. Patient understands to call if she does not receive that CALL in a timely manner. Patient agrees with treatment and thanked me for call.

## 2018-01-07 ENCOUNTER — Encounter: Payer: Self-pay | Admitting: *Deleted

## 2018-01-07 NOTE — Telephone Encounter (Signed)
Late entry: Fax came today for Meredith Shah from [facility] [facility] with an order for Cancellation of her home sleep test.  Reason for cancellation: We have exhausted all attempts to schedule the patient for her home sleep test. No response from patient. I will send a no contact letter.
